# Patient Record
Sex: Male | Born: 1950
Health system: Southern US, Community
[De-identification: ages and names within clinical notes are randomized; demographics above are authoritative.]

## PROBLEM LIST (undated history)

## (undated) DIAGNOSIS — Z8781 Personal history of (healed) traumatic fracture: Secondary | ICD-10-CM

## (undated) DIAGNOSIS — I10 Essential (primary) hypertension: Secondary | ICD-10-CM

## (undated) DIAGNOSIS — M1990 Unspecified osteoarthritis, unspecified site: Secondary | ICD-10-CM

## (undated) HISTORY — DX: Personal history of (healed) traumatic fracture: Z87.81

## (undated) HISTORY — DX: Essential (primary) hypertension: I10

## (undated) HISTORY — PX: BACK SURGERY: SHX140

---

## 1997-10-01 ENCOUNTER — Emergency Department (HOSPITAL_COMMUNITY): Admission: EM | Admit: 1997-10-01 | Discharge: 1997-10-01 | Payer: Self-pay | Admitting: Emergency Medicine

## 1997-12-10 ENCOUNTER — Encounter: Admission: RE | Admit: 1997-12-10 | Discharge: 1998-03-10 | Payer: Self-pay | Admitting: Neurosurgery

## 2002-08-14 ENCOUNTER — Encounter: Payer: Self-pay | Admitting: Family Medicine

## 2002-08-14 ENCOUNTER — Encounter: Admission: RE | Admit: 2002-08-14 | Discharge: 2002-08-14 | Payer: Self-pay | Admitting: Anesthesiology

## 2004-07-27 ENCOUNTER — Emergency Department (HOSPITAL_COMMUNITY): Admission: EM | Admit: 2004-07-27 | Discharge: 2004-07-27 | Payer: Self-pay

## 2004-09-02 ENCOUNTER — Emergency Department (HOSPITAL_COMMUNITY): Admission: EM | Admit: 2004-09-02 | Discharge: 2004-09-02 | Payer: Self-pay | Admitting: Family Medicine

## 2004-09-16 ENCOUNTER — Encounter: Admission: RE | Admit: 2004-09-16 | Discharge: 2004-09-16 | Payer: Self-pay | Admitting: Orthopedic Surgery

## 2005-12-11 ENCOUNTER — Emergency Department (HOSPITAL_COMMUNITY): Admission: EM | Admit: 2005-12-11 | Discharge: 2005-12-11 | Payer: Self-pay | Admitting: Emergency Medicine

## 2006-03-03 ENCOUNTER — Emergency Department (HOSPITAL_COMMUNITY): Admission: EM | Admit: 2006-03-03 | Discharge: 2006-03-04 | Payer: Self-pay | Admitting: Emergency Medicine

## 2008-11-07 ENCOUNTER — Emergency Department (HOSPITAL_COMMUNITY): Admission: EM | Admit: 2008-11-07 | Discharge: 2008-11-07 | Payer: Self-pay | Admitting: Emergency Medicine

## 2009-05-29 ENCOUNTER — Emergency Department (HOSPITAL_COMMUNITY): Admission: EM | Admit: 2009-05-29 | Discharge: 2009-05-29 | Payer: Self-pay | Admitting: Emergency Medicine

## 2010-10-05 LAB — URINALYSIS, ROUTINE W REFLEX MICROSCOPIC
Glucose, UA: NEGATIVE mg/dL
Hgb urine dipstick: NEGATIVE
Nitrite: NEGATIVE
Specific Gravity, Urine: 1.018 (ref 1.005–1.030)
Urobilinogen, UA: 2 mg/dL — ABNORMAL HIGH (ref 0.0–1.0)

## 2010-10-11 LAB — POCT URINALYSIS DIP (DEVICE)
Bilirubin Urine: NEGATIVE
Glucose, UA: NEGATIVE mg/dL
Ketones, ur: NEGATIVE mg/dL
Nitrite: NEGATIVE
Specific Gravity, Urine: 1.01 (ref 1.005–1.030)
Urobilinogen, UA: 0.2 mg/dL (ref 0.0–1.0)
pH: 5.5 (ref 5.0–8.0)

## 2010-11-18 NOTE — Op Note (Signed)
NAME:  Austin Harrington, SPRUNG NO.:  [id number]   MEDICAL RECORD NO.:  [id number]          PATIENT TYPE:  EMS   LOCATION:  MAJO                         FACILITY:  [facility]   PHYSICIAN:  Nadara Mustard, MD     DATE OF BIRTH:  10-23-1950   DATE OF PROCEDURE:  12/11/2005  DATE OF DISCHARGE:                                 OPERATIVE REPORT   HISTORY OF PRESENT ILLNESS:  The patient is a 60 year old gentleman who  states he was working under his car when the jack slipped and he sustained a  crush injury to the left hand.  He had a ring on the ring finger and this  was a spare to the crushing injury due to the ring, which was partially  crushed but he did sustain a open fracture of the tuft of the long and  little finger to the left hand with complete avulsion of the nail bed.  The  patient is seen for evaluation and treatment of his orthopedic injury.  For  list of medications, allergies, previous surgeries, family history, social  history, review of systems and vital signs, please see his history and  physical summary sheet.  Of note, the patient has no known drug allergies.  His tetanus is up-to-date 1 year ago.   OBJECTIVE EXAMINATION:  He has an open tuft fracture with complete nail bed  laceration of the long and little finger.  FDS and FDP a grossly intact and  sensation is unable to be judged due to a previous digital block.  Radiograph shows a comminuted tuft fracture of the long and little finger.   ASSESSMENT:  Open comminuted tuft fracture of the left hand long and little  finger with a nail bed laceration.   PLAN:  After informed and sterile prepping, the patient's left hand was  prepped using Betadine, draped into a sterile field.  Additional digital  block was performed with 10 mL of 1% lidocaine plain to the little and ring  and long finger.  The ring was removed without complications.  The open  fracture of the little and ring finger were irrigated with normal  saline and  Betadine.  The skin lacerations were repaired to both fingers using 4-0  nylon.  The nail bed was repaired using 5-0 chromic.  The nail was replaced  to the long finger.  The little finger, the nail was absent.  The wounds  were covered with a triple antibiotic ointment, Xeroform, 4x4 dressings,  Kerlix and a Coban dressing.  The patient received 3.1 grams Unasyn IV.  He  was given a prescription for Augmentin 875 mg one p.o. b.i.d. He was given a  prescription for Vicodin for pain, instructions for elevation of the hand  above his heart at all times.  Plan to follow-up in the office in 1 week.      Nadara Mustard, MD  Electronically Signed     MVD/MEDQ  D:  12/11/2005  T:  12/12/2005  Job:  [id number]

## 2014-12-10 ENCOUNTER — Encounter (HOSPITAL_COMMUNITY): Payer: Self-pay | Admitting: *Deleted

## 2014-12-10 ENCOUNTER — Emergency Department (INDEPENDENT_AMBULATORY_CARE_PROVIDER_SITE_OTHER)
Admission: EM | Admit: 2014-12-10 | Discharge: 2014-12-10 | Disposition: A | Discharge status: Home / Self Care | Payer: Medicare Other | Source: Home / Self Care | Attending: Family Medicine | Admitting: Family Medicine

## 2014-12-10 DIAGNOSIS — R1013 Epigastric pain: Secondary | ICD-10-CM

## 2014-12-10 DIAGNOSIS — M545 Low back pain, unspecified: Secondary | ICD-10-CM

## 2014-12-10 DIAGNOSIS — K219 Gastro-esophageal reflux disease without esophagitis: Secondary | ICD-10-CM

## 2014-12-10 DIAGNOSIS — S39012A Strain of muscle, fascia and tendon of lower back, initial encounter: Secondary | ICD-10-CM

## 2014-12-10 MED ORDER — FAMOTIDINE 20 MG PO TABS: ORAL_TABLET | ORAL | Status: DC

## 2014-12-10 MED ORDER — OMEPRAZOLE 20 MG PO CPDR: 20.0000 mg | DELAYED_RELEASE_CAPSULE | Freq: Every day | ORAL | Status: DC

## 2014-12-10 MED ORDER — DICLOFENAC SODIUM 1 % TD GEL: 1.0000 "application " | Freq: Four times a day (QID) | TRANSDERMAL | Status: DC

## 2014-12-10 NOTE — ED Notes (Signed)
Pt  Reports        Symptoms  Of          Acid  Reflux   With   Burning  Sensation  Epigastric  Area           Pt   Sitting  Upright on  The  Exam table   Speaking in   Complete  sentances  In no   acute  Distress

## 2014-12-10 NOTE — ED Provider Notes (Signed)
CSN: [id number]     Arrival date & time 12/10/14  1425 History   First MD Initiated Contact with Patient 12/10/14 1544     Chief Complaint  Patient presents with  . Gastrophageal Reflux   (Consider location/radiation/quality/duration/timing/severity/associated sxs/prior Treatment) HPI Comments: 64 year old male complaining of reflux, heartburn and belching for 1 month. He states it is worse when he lies down and after eating. Denies heaviness, tightness, fullness or pressure in the chest. Denies vomiting. Denies inability or pain with swallowing. Denies abdominal pain, nausea or vomiting.  Second complaint is that of right low back pain for 2 weeks. Gradual onset. No known etiology. It is located in the right para lumbar musculature that radiates to the right posterior hip. It is tender to these areas and it is worse with movement such as bending, twisting, pulling and lifting. Denies focal paresthesias or weakness. Denies radicular pain.   No past medical history on file. No past surgical history on file. No family history on file. History  Substance Use Topics  . Smoking status: Not on file  . Smokeless tobacco: Not on file  . Alcohol Use: Not on file    Review of Systems  Constitutional: Negative.        Review of systems as above.  All other systems reviewed and are negative.   Allergies  Review of patient's allergies indicates not on file.  Home Medications   Prior to Admission medications   Medication Sig Start Date End Date Taking? Authorizing Provider  diclofenac sodium (VOLTAREN) 1 % GEL Apply 1 application topically 4 (four) times daily. To back 12/10/14   Hayden Rasmussen, NP  famotidine (PEPCID) 20 MG tablet One tab 2 h before hs 12/10/14   Hayden Rasmussen, NP  omeprazole (PRILOSEC) 20 MG capsule Take 1 capsule (20 mg total) by mouth daily. 12/10/14   Hayden Rasmussen, NP   BP 141/66 mmHg  Pulse 65  Temp(Src) 97.5 F (36.4 C) (Oral)  Resp 16  SpO2 100% Physical Exam   Constitutional: He is oriented to person, place, and time. He appears well-developed and well-nourished.  HENT:  Head: Normocephalic and atraumatic.  Eyes: EOM are normal.  Neck: Normal range of motion. Neck supple.  Musculoskeletal:  Tenderness to the right para lumbar musculature just beneath the posterior costal margin to the right hip. Pain is reproducible as patient leans forward and particularly towards the left. No spinal tenderness, deformity or discoloration.  Neurological: He is alert and oriented to person, place, and time. No cranial nerve deficit. He exhibits normal muscle tone.  Skin: Skin is warm and dry.  Psychiatric: He has a normal mood and affect.    ED Course  Procedures (including critical care time) Labs Review Labs Reviewed - No data to display  Imaging Review No results found.   MDM   1. Right-sided low back pain without sciatica   2. Lumbar strain, initial encounter   3. Gastroesophageal reflux disease, esophagitis presence not specified   4. Dyspepsia    Omeprazole 20 mg daily Pepcid 20 mg 2 hours before bedtime Limit eating 2 hours before bedtime and no greasy or spicy foods. Redress instructions Limit bending, lifting, pulling for the next couple of weeks to lie her back to heal. Apply heat and diclofenac gel to the areas of soreness. Perform stretches as demonstrated. Follow-up with her primary care doctor.   Hayden Rasmussen, NP 12/10/14 [phone]

## 2014-12-10 NOTE — Discharge Instructions (Signed)
Back Pain, Adult °Low back pain is very common. About 1 in 5 people have back pain. The cause of low back pain is rarely dangerous. The pain often gets better over time. About half of people with a sudden onset of back pain feel better in just 2 weeks. About 8 in 10 people feel better by 6 weeks.  °CAUSES °Some common causes of back pain include: °· Strain of the muscles or ligaments supporting the spine. °· Wear and tear (degeneration) of the spinal discs. °· Arthritis. °· Direct injury to the back. °DIAGNOSIS °Most of the time, the direct cause of low back pain is not known. However, back pain can be treated effectively even when the exact cause of the pain is unknown. Answering your caregiver's questions about your overall health and symptoms is one of the most accurate ways to make sure the cause of your pain is not dangerous. If your caregiver needs more information, he or she may order lab work or imaging tests (X-rays or MRIs). However, even if imaging tests show changes in your back, this usually does not require surgery. °HOME CARE INSTRUCTIONS °For many people, back pain returns. Since low back pain is rarely dangerous, it is often a condition that people can learn to manage on their own.  °· Remain active. It is stressful on the back to sit or stand in one place. Do not sit, drive, or stand in one place for more than 30 minutes at a time. Take short walks on level surfaces as soon as pain allows. Try to increase the length of time you walk each day. °· Do not stay in bed. Resting more than 1 or 2 days can delay your recovery. °· Do not avoid exercise or work. Your body is made to move. It is not dangerous to be active, even though your back may hurt. Your back will likely heal faster if you return to being active before your pain is gone. °· Pay attention to your body when you  bend and lift. Many people have less discomfort when lifting if they bend their knees, keep the load close to their bodies, and  avoid twisting. Often, the most comfortable positions are those that put less stress on your recovering back. °· Find a comfortable position to sleep. Use a firm mattress and lie on your side with your knees slightly bent. If you lie on your back, put a pillow under your knees. °· Only take over-the-counter or prescription medicines as directed by your caregiver. Over-the-counter medicines to reduce pain and inflammation are often the most helpful. Your caregiver may prescribe muscle relaxant drugs. These medicines help dull your pain so you can more quickly return to your normal activities and healthy exercise. °· Put ice on the injured area. °¨ Put ice in a plastic bag. °¨ Place a towel between your skin and the bag. °¨ Leave the ice on for 15-20 minutes, 03-04 times a day for the first 2 to 3 days. After that, ice and heat may be alternated to reduce pain and spasms. °· Ask your caregiver about trying back exercises and gentle massage. This may be of some benefit. °· Avoid feeling anxious or stressed. Stress increases muscle tension and can worsen back pain. It is important to recognize when you are anxious or stressed and learn ways to manage it. Exercise is a great option. °SEEK MEDICAL CARE IF: °· You have pain that is not relieved with rest or medicine. °· You have pain that does not improve in 1 week. °· You have new symptoms. °· You are generally not feeling well. °SEEK   IMMEDIATE MEDICAL CARE IF:   You have pain that radiates from your back into your legs.  You develop new bowel or bladder control problems.  You have unusual weakness or numbness in your arms or legs.  You develop nausea or vomiting.  You develop abdominal pain.  You feel faint. Document Released: 06/19/2005 Document Revised: 12/19/2011 Document Reviewed: 10/21/2013 [facility] Patient Information 2015 [place], [place]. This information is not intended to replace advice given to you by your health care provider. Make sure you  discuss any questions you have with your health care provider.  Gastroesophageal Reflux Disease, Adult Gastroesophageal reflux disease (GERD) happens when acid from your stomach goes into your food pipe (esophagus). The acid can cause a burning feeling in your chest. Over time, the acid can make small holes (ulcers) in your food pipe.  HOME CARE  Ask your doctor for advice about:  Losing weight.  Quitting smoking.  Alcohol use.  Avoid foods and drinks that make your problems worse. You may want to avoid:  Caffeine and alcohol.  Chocolate.  Mints.  Garlic and onions.  Spicy foods.  Citrus fruits, such as oranges, lemons, or limes.  Foods that contain tomato, such as sauce, chili, salsa, and pizza.  Fried and fatty foods.  Avoid lying down for 3 hours before you go to bed or before you take a nap.  Eat small meals often, instead of large meals.  Wear loose-fitting clothing. Do not wear anything tight around your waist.  Raise (elevate) the head of your bed 6 to 8 inches with wood blocks. Using extra pillows does not help.  Only take medicines as told by your doctor.  Do not take aspirin or ibuprofen. GET HELP RIGHT AWAY IF:   You have pain in your arms, neck, jaw, teeth, or back.  Your pain gets worse or changes.  You feel sick to your stomach (nauseous), throw up (vomit), or sweat (diaphoresis).  You feel short of breath, or you pass out (faint).  Your throw up is green, yellow, black, or looks like coffee grounds or blood.  Your poop (stool) is red, bloody, or black. MAKE SURE YOU:   Understand these instructions.  Will watch your condition.  Will get help right away if you are not doing well or get worse. Document Released: 12/06/2007 Document Revised: 09/11/2011 Document Reviewed: 01/06/2011 [facility] Patient Information 2015 [place], [place]. This information is not intended to replace advice given to you by your health care provider. Make sure you  discuss any questions you have with your health care provider.  Lumbosacral Strain Lumbosacral strain is a strain of any of the parts that make up your lumbosacral vertebrae. Your lumbosacral vertebrae are the bones that make up the lower third of your backbone. Your lumbosacral vertebrae are held together by muscles and tough, fibrous tissue (ligaments).  CAUSES  A sudden blow to your back can cause lumbosacral strain. Also, anything that causes an excessive stretch of the muscles in the low back can cause this strain. This is typically seen when people exert themselves strenuously, fall, lift heavy objects, bend, or crouch repeatedly. RISK FACTORS  Physically demanding work.  Participation in pushing or pulling sports or sports that require a sudden twist of the back (tennis, golf, baseball).  Weight lifting.  Excessive lower back curvature.  Forward-tilted pelvis.  Weak back or abdominal muscles or both.  Tight hamstrings. SIGNS AND SYMPTOMS  Lumbosacral strain may cause pain in the area of your injury or pain  that moves (radiates) down your leg.  DIAGNOSIS Your health care provider can often diagnose lumbosacral strain through a physical exam. In some cases, you may need tests such as X-ray exams.  TREATMENT  Treatment for your lower back injury depends on many factors that your clinician will have to evaluate. However, most treatment will include the use of anti-inflammatory medicines. HOME CARE INSTRUCTIONS   Avoid hard physical activities (tennis, racquetball, waterskiing) if you are not in proper physical condition for it. This may aggravate or create problems.  If you have a back problem, avoid sports requiring sudden body movements. Swimming and walking are generally safer activities.  Maintain good posture.  Maintain a healthy weight.  For acute conditions, you may put ice on the injured area.  Put ice in a plastic bag.  Place a towel between your skin and the  bag.  Leave the ice on for 20 minutes, 2-3 times a day.  When the low back starts healing, stretching and strengthening exercises may be recommended. SEEK MEDICAL CARE IF:  Your back pain is getting worse.  You experience severe back pain not relieved with medicines. SEEK IMMEDIATE MEDICAL CARE IF:   You have numbness, tingling, weakness, or problems with the use of your arms or legs.  There is a change in bowel or bladder control.  You have increasing pain in any area of the body, including your belly (abdomen).  You notice shortness of breath, dizziness, or feel faint.  You feel sick to your stomach (nauseous), are throwing up (vomiting), or become sweaty.  You notice discoloration of your toes or legs, or your feet get very cold. MAKE SURE YOU:   Understand these instructions.  Will watch your condition.  Will get help right away if you are not doing well or get worse. Document Released: 03/29/2005 Document Revised: 06/24/2013 Document Reviewed: 02/05/2013 [facility] Patient Information 2015 [place], [place]. This information is not intended to replace advice given to you by your health care provider. Make sure you discuss any questions you have with your health care provider.

## 2015-05-18 DIAGNOSIS — H11821 Conjunctivochalasis, right eye: Secondary | ICD-10-CM | POA: Diagnosis not present

## 2015-05-18 DIAGNOSIS — H25812 Combined forms of age-related cataract, left eye: Secondary | ICD-10-CM | POA: Diagnosis not present

## 2015-05-18 DIAGNOSIS — H2511 Age-related nuclear cataract, right eye: Secondary | ICD-10-CM | POA: Diagnosis not present

## 2015-08-17 ENCOUNTER — Other Ambulatory Visit: Payer: Self-pay | Admitting: Cardiovascular Disease

## 2015-08-17 ENCOUNTER — Ambulatory Visit
Admission: RE | Admit: 2015-08-17 | Discharge: 2015-08-17 | Disposition: A | Discharge status: Home / Self Care | Payer: Commercial Managed Care - HMO | Source: Ambulatory Visit | Attending: Cardiovascular Disease | Admitting: Cardiovascular Disease

## 2015-08-17 DIAGNOSIS — R0602 Shortness of breath: Secondary | ICD-10-CM

## 2015-08-19 ENCOUNTER — Encounter (HOSPITAL_COMMUNITY)
Admission: RE | Disposition: A | Discharge status: Home / Self Care | Payer: Self-pay | Source: Ambulatory Visit | Attending: Cardiovascular Disease

## 2015-08-19 ENCOUNTER — Ambulatory Visit (HOSPITAL_COMMUNITY)
Admission: RE | Admit: 2015-08-19 | Discharge: 2015-08-19 | Disposition: A | Discharge status: Home / Self Care | Payer: Commercial Managed Care - HMO | Source: Ambulatory Visit | Attending: Cardiovascular Disease | Admitting: Cardiovascular Disease

## 2015-08-19 DIAGNOSIS — R0789 Other chest pain: Secondary | ICD-10-CM | POA: Diagnosis not present

## 2015-08-19 DIAGNOSIS — Z8249 Family history of ischemic heart disease and other diseases of the circulatory system: Secondary | ICD-10-CM | POA: Insufficient documentation

## 2015-08-19 DIAGNOSIS — Z7982 Long term (current) use of aspirin: Secondary | ICD-10-CM | POA: Diagnosis not present

## 2015-08-19 DIAGNOSIS — E7800 Pure hypercholesterolemia, unspecified: Secondary | ICD-10-CM | POA: Diagnosis not present

## 2015-08-19 DIAGNOSIS — R079 Chest pain, unspecified: Secondary | ICD-10-CM | POA: Diagnosis present

## 2015-08-19 DIAGNOSIS — R0602 Shortness of breath: Secondary | ICD-10-CM | POA: Diagnosis present

## 2015-08-19 HISTORY — PX: CARDIAC CATHETERIZATION: SHX172

## 2015-08-19 LAB — BASIC METABOLIC PANEL
Anion gap: 9 (ref 5–15)
BUN: 10 mg/dL (ref 6–20)
CALCIUM: 9.8 mg/dL (ref 8.9–10.3)
CO2: 26 mmol/L (ref 22–32)
CREATININE: 0.99 mg/dL (ref 0.61–1.24)
Chloride: 105 mmol/L (ref 101–111)
GFR calc Af Amer: >60 mL/min (ref >60)
Glucose, Bld: 103 mg/dL — ABNORMAL HIGH (ref 65–99)
POTASSIUM: 4.4 mmol/L (ref 3.5–5.1)
SODIUM: 140 mmol/L (ref 135–145)

## 2015-08-19 LAB — PROTIME-INR
INR: 1 (ref 0.00–1.49)
PROTHROMBIN TIME: 13.4 s (ref 11.6–15.2)

## 2015-08-19 LAB — CBC
HCT: 44 % (ref 39.0–52.0)
Hemoglobin: 14.8 g/dL (ref 13.0–17.0)
MCH: 29.9 pg (ref 26.0–34.0)
MCHC: 33.6 g/dL (ref 30.0–36.0)
MCV: 88.9 fL (ref 78.0–100.0)
PLATELETS: 228 10*3/uL (ref 150–400)
RBC: 4.95 MIL/uL (ref 4.22–5.81)
RDW: 12.1 % (ref 11.5–15.5)
WBC: 5 10*3/uL (ref 4.0–10.5)

## 2015-08-19 SURGERY — LEFT HEART CATH AND CORONARY ANGIOGRAPHY

## 2015-08-19 MED ORDER — FENTANYL CITRATE (PF) 100 MCG/2ML IJ SOLN
INTRAMUSCULAR | Status: DC | PRN
  Administered 2015-08-19: 25 ug via INTRAVENOUS

## 2015-08-19 MED ORDER — SODIUM CHLORIDE 0.9% FLUSH: 3.0000 mL | Freq: Two times a day (BID) | INTRAVENOUS | Status: DC

## 2015-08-19 MED ORDER — MIDAZOLAM HCL 2 MG/2ML IJ SOLN
INTRAMUSCULAR | Status: DC | PRN
  Administered 2015-08-19: 1 mg via INTRAVENOUS

## 2015-08-19 MED ORDER — LIDOCAINE HCL (PF) 1 % IJ SOLN
INTRAMUSCULAR | Status: AC
  Filled 2015-08-19: qty 30

## 2015-08-19 MED ORDER — ONDANSETRON HCL 4 MG/2ML IJ SOLN: 4.0000 mg | Freq: Four times a day (QID) | INTRAMUSCULAR | Status: DC | PRN

## 2015-08-19 MED ORDER — SODIUM CHLORIDE 0.9% FLUSH: 3.0000 mL | INTRAVENOUS | Status: DC | PRN

## 2015-08-19 MED ORDER — FENTANYL CITRATE (PF) 100 MCG/2ML IJ SOLN
INTRAMUSCULAR | Status: AC
  Filled 2015-08-19: qty 2

## 2015-08-19 MED ORDER — HEPARIN (PORCINE) IN NACL 2-0.9 UNIT/ML-% IJ SOLN
INTRAMUSCULAR | Status: AC
  Filled 2015-08-19: qty 1000

## 2015-08-19 MED ORDER — MIDAZOLAM HCL 2 MG/2ML IJ SOLN
INTRAMUSCULAR | Status: AC
  Filled 2015-08-19: qty 2

## 2015-08-19 MED ORDER — HEPARIN (PORCINE) IN NACL 2-0.9 UNIT/ML-% IJ SOLN
INTRAMUSCULAR | Status: DC | PRN
  Administered 2015-08-19: 1000 mL

## 2015-08-19 MED ORDER — ASPIRIN 81 MG PO CHEW
CHEWABLE_TABLET | ORAL | Status: AC
  Administered 2015-08-19: 81 mg via ORAL
  Filled 2015-08-19: qty 1

## 2015-08-19 MED ORDER — ACETAMINOPHEN 325 MG PO TABS
650.0000 mg | ORAL_TABLET | ORAL | Status: DC | PRN
  Administered 2015-08-19: 650 mg via ORAL

## 2015-08-19 MED ORDER — LIDOCAINE HCL (PF) 1 % IJ SOLN
INTRAMUSCULAR | Status: DC | PRN
  Administered 2015-08-19: 16 mL

## 2015-08-19 MED ORDER — SODIUM CHLORIDE 0.9 % WEIGHT BASED INFUSION: 1.0000 mL/kg/h | INTRAVENOUS | Status: DC

## 2015-08-19 MED ORDER — ASPIRIN 81 MG PO CHEW
81.0000 mg | CHEWABLE_TABLET | ORAL | Status: AC
  Administered 2015-08-19: 81 mg via ORAL

## 2015-08-19 MED ORDER — SODIUM CHLORIDE 0.9 % IV SOLN: 250.0000 mL | INTRAVENOUS | Status: DC | PRN

## 2015-08-19 MED ORDER — ACETAMINOPHEN 325 MG PO TABS
ORAL_TABLET | ORAL | Status: AC
  Filled 2015-08-19: qty 2

## 2015-08-19 MED ORDER — SODIUM CHLORIDE 0.9 % WEIGHT BASED INFUSION
3.0000 mL/kg/h | INTRAVENOUS | Status: AC
  Administered 2015-08-19: 999 mL/h via INTRAVENOUS
  Administered 2015-08-19: 3 mL/kg/h via INTRAVENOUS

## 2015-08-19 MED ORDER — IOHEXOL 350 MG/ML SOLN
INTRAVENOUS | Status: DC | PRN
  Administered 2015-08-19: 48 mL via INTRAVENOUS

## 2015-08-19 MED ORDER — SODIUM CHLORIDE 0.9 % WEIGHT BASED INFUSION: 3.0000 mL/kg/h | INTRAVENOUS | Status: DC

## 2015-08-19 SURGICAL SUPPLY — 8 items
CATH INFINITI 5FR MULTPACK ANG (CATHETERS) ×1 IMPLANT
CATH SITESEER 5F NTR (CATHETERS) ×1 IMPLANT
KIT HEART LEFT (KITS) ×1 IMPLANT
PACK CARDIAC CATHETERIZATION (CUSTOM PROCEDURE TRAY) ×1 IMPLANT
SHEATH PINNACLE 5F 10CM (SHEATH) ×1 IMPLANT
SYR MEDRAD MARK V 150ML (SYRINGE) ×2 IMPLANT
TRANSDUCER W/STOPCOCK (MISCELLANEOUS) ×1 IMPLANT
WIRE EMERALD 3MM-J .035X150CM (WIRE) ×1 IMPLANT

## 2015-08-19 NOTE — Discharge Instructions (Signed)

## 2015-08-19 NOTE — Interval H&P Note (Signed)
History and Physical Interval Note:  08/19/2015 1:38 PM  Austin Harrington  has presented today for surgery, with the diagnosis of positive stress test  The various methods of treatment have been discussed with the patient and family. After consideration of risks, benefits and other options for treatment, the patient has consented to  Procedure(s): Left Heart Cath and Coronary Angiography (N/A) as a surgical intervention .  The patient's history has been reviewed, patient examined, no change in status, stable for surgery.  I have reviewed the patient's chart and labs.  Questions were answered to the patient's satisfaction.     Morris Longenecker S

## 2015-08-19 NOTE — H&P (Signed)
Referring Physician:  MARIEL GAUDIN is an 65 y.o. male.                       Chief Complaint: Chest pressure  HPI: 65 year old male has chest pressure and shortness of breath x 3-4 months. He underwent TMST showing 2-5 mm ST depression in inferior leads with frequent PVCs. No fever or cough.   Past medical history: No DM, II, No hypertension, No smoking, No alcohol or drug use. Positive elevated cholesterol. No MI, Positive FH of premature CAD.   Past surgical history: Back surgery 1994-11-15.  Family history: Mom died of MI age 37, Dad died of MI age 82. Five brothers and 2 sisters, living and well.   Social History:  has no tobacco, alcohol, and drug use.  Allergies: No Known Allergies  Medications Prior to Admission  Medication Sig Dispense Refill  . aspirin 81 MG tablet Take 81 mg by mouth daily.      No results found for this or any previous visit (from the past 48 hour(s)). Dg Chest 2 View  08/17/2015  CLINICAL DATA:  Shortness of Breath for several months EXAM: CHEST  2 VIEW COMPARISON:  03/03/2006 FINDINGS: Cardiac shadow is stable. The lungs are well aerated bilaterally. No focal infiltrate or sizable effusion is seen. No acute bony abnormality is noted. IMPRESSION: No acute abnormality seen. Electronically Signed   By: Alcide Clever M.D.   On: 08/17/2015 11:39    Review Of Systems No weight gain or loss No vision change. Wears reading glasses.  No dentures.  No Hearing loss. No asthma, COPD or pneumonia. Positive shortness of breath. Positive chest pain. No palpitation. No leg edema. No abdominal pain, nausea, vomiting or diarrhea. No GI bleed. No GU bleed or kidney stone. No stroke, seizures or psychiatric admission.  Blood pressure 138/79, pulse 63, temperature 97.6 F (36.4 C), temperature source Oral, resp. rate 18, height  (1.753 m), weight 97.07 kg (214 lb), SpO2 100 %. General appearance: alert and cooperative Head: Normocephalic, without obvious abnormality,  atraumatic Eyes: conjunctivae/corneas clear. PERRL, EOM's intact. Fundi benign. Throat: lips, mucosa, and tongue normal; teeth and gums normal Neck: no adenopathy, no carotid bruit, no JVD, supple, symmetrical, trachea midline and thyroid not enlarged, symmetric, no tenderness/mass/nodules Resp: clear to auscultation bilaterally Cardio: regular rate and rhythm, S1, S2 normal, no murmur, click, rub or gallop GI: soft, non-tender; bowel sounds normal; no masses,  no organomegaly Extremities: extremities normal, atraumatic, no cyanosis or edema Skin: Skin color, texture, turgor normal. No rashes or lesions Neurologic: Alert and oriented X 3, normal strength and tone. Normal symmetric reflexes. Normal coordination and gait  Assessment/Plan Chest pain Shortness of breath Abnormal TMST  Cardiac cath today.  Patient understood procedure, risks and alternatives.  Ricki Rodriguez, MD  08/19/2015, 9:38 AM

## 2015-08-20 ENCOUNTER — Encounter (HOSPITAL_COMMUNITY): Payer: Self-pay | Admitting: Cardiovascular Disease

## 2015-12-19 ENCOUNTER — Encounter (HOSPITAL_COMMUNITY): Payer: Self-pay | Admitting: Emergency Medicine

## 2015-12-19 ENCOUNTER — Ambulatory Visit (HOSPITAL_COMMUNITY)
Admission: EM | Admit: 2015-12-19 | Discharge: 2015-12-19 | Disposition: A | Discharge status: Home / Self Care | Payer: Commercial Managed Care - HMO | Attending: Family Medicine | Admitting: Family Medicine

## 2015-12-19 DIAGNOSIS — M5431 Sciatica, right side: Secondary | ICD-10-CM | POA: Diagnosis not present

## 2015-12-19 DIAGNOSIS — S161XXA Strain of muscle, fascia and tendon at neck level, initial encounter: Secondary | ICD-10-CM

## 2015-12-19 MED ORDER — HYDROCODONE-ACETAMINOPHEN 5-325 MG PO TABS: 1.0000 | ORAL_TABLET | ORAL | Status: DC | PRN

## 2015-12-19 MED ORDER — PREDNISONE 20 MG PO TABS: 40.0000 mg | ORAL_TABLET | Freq: Every day | ORAL | Status: DC

## 2015-12-19 MED ORDER — PREDNISONE 20 MG PO TABS
ORAL_TABLET | ORAL | Status: AC
  Filled 2015-12-19: qty 3

## 2015-12-19 MED ORDER — PREDNISONE 20 MG PO TABS
60.0000 mg | ORAL_TABLET | Freq: Once | ORAL | Status: AC
  Administered 2015-12-19: 60 mg via ORAL

## 2015-12-19 MED ORDER — CYCLOBENZAPRINE HCL 10 MG PO TABS: 10.0000 mg | ORAL_TABLET | Freq: Three times a day (TID) | ORAL | Status: DC | PRN
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
## 2016-01-12 ENCOUNTER — Encounter (HOSPITAL_COMMUNITY): Payer: Self-pay | Admitting: *Deleted

## 2016-01-12 ENCOUNTER — Emergency Department (HOSPITAL_COMMUNITY)
Admission: EM | Admit: 2016-01-12 | Discharge: 2016-01-12 | Disposition: A | Discharge status: Home / Self Care | Payer: Commercial Managed Care - HMO | Attending: Emergency Medicine | Admitting: Emergency Medicine

## 2016-01-12 DIAGNOSIS — Z7982 Long term (current) use of aspirin: Secondary | ICD-10-CM | POA: Insufficient documentation

## 2016-01-12 DIAGNOSIS — Z955 Presence of coronary angioplasty implant and graft: Secondary | ICD-10-CM | POA: Insufficient documentation

## 2016-01-12 DIAGNOSIS — M5441 Lumbago with sciatica, right side: Secondary | ICD-10-CM | POA: Diagnosis not present

## 2016-01-12 DIAGNOSIS — Z87891 Personal history of nicotine dependence: Secondary | ICD-10-CM | POA: Insufficient documentation

## 2016-01-12 DIAGNOSIS — M542 Cervicalgia: Secondary | ICD-10-CM | POA: Insufficient documentation

## 2016-01-12 NOTE — Discharge Instructions (Signed)
Cervical Strain and Sprain With Rehab  Cervical strain and sprain are injuries that commonly occur with "whiplash" injuries. Whiplash occurs when the neck is forcefully whipped backward or forward, such as during a motor vehicle accident or during contact sports. The muscles, ligaments, tendons, discs, and nerves of the neck are susceptible to injury when this occurs.  RISK FACTORS  Risk of having a whiplash injury increases if:  · Osteoarthritis of the spine.  · Situations that make head or neck accidents or trauma more likely.  · High-risk sports (football, rugby, wrestling, hockey, auto racing, gymnastics, diving, contact karate, or boxing).  · Poor strength and flexibility of the neck.  · Previous neck injury.  · Poor tackling technique.  · Improperly fitted or padded equipment.  SYMPTOMS   · Pain or stiffness in the front or back of neck or both.  · Symptoms may present immediately or up to 24 hours after injury.  · Dizziness, headache, nausea, and vomiting.  · Muscle spasm with soreness and stiffness in the neck.  · Tenderness and swelling at the injury site.  PREVENTION  · Learn and use proper technique (avoid tackling with the head, spearing, and head-butting; use proper falling techniques to avoid landing on the head).  · Warm up and stretch properly before activity.  · Maintain physical fitness:    Strength, flexibility, and endurance.    Cardiovascular fitness.  · Wear properly fitted and padded protective equipment, such as padded soft collars, for participation in contact sports.  PROGNOSIS   Recovery from cervical strain and sprain injuries is dependent on the extent of the injury. These injuries are usually curable in 1 week to 3 months with appropriate treatment.   RELATED COMPLICATIONS   · Temporary numbness and weakness may occur if the nerve roots are damaged, and this may persist until the nerve has completely healed.  · Chronic pain due to frequent recurrence of symptoms.  · Prolonged healing,  especially if activity is resumed too soon (before complete recovery).  TREATMENT   Treatment initially involves the use of ice and medication to help reduce pain and inflammation. It is also important to perform strengthening and stretching exercises and modify activities that worsen symptoms so the injury does not get worse. These exercises may be performed at home or with a therapist. For patients who experience severe symptoms, a soft, padded collar may be recommended to be worn around the neck.   Improving your posture may help reduce symptoms. Posture improvement includes pulling your chin and abdomen in while sitting or standing. If you are sitting, sit in a firm chair with your buttocks against the back of the chair. While sleeping, try replacing your pillow with a small towel rolled to 2 inches in diameter, or use a cervical pillow or soft cervical collar. Poor sleeping positions delay healing.   For patients with nerve root damage, which causes numbness or weakness, the use of a cervical traction apparatus may be recommended. Surgery is rarely necessary for these injuries. However, cervical strain and sprains that are present at birth (congenital) may require surgery.  MEDICATION   · If pain medication is necessary, nonsteroidal anti-inflammatory medications, such as aspirin and ibuprofen, or other minor pain relievers, such as acetaminophen, are often recommended.  · Do not take pain medication for 7 days before surgery.  · Prescription pain relievers may be given if deemed necessary by your caregiver. Use only as directed and only as much as you need.    HEAT AND COLD:   · Cold treatment (icing) relieves pain and reduces inflammation. Cold treatment should be applied for 10 to 15 minutes every 2 to 3 hours for inflammation and pain and immediately after any activity that aggravates your symptoms. Use ice packs or an ice massage.  · Heat treatment may be used prior to performing the stretching and  strengthening activities prescribed by your caregiver, physical therapist, or athletic trainer. Use a heat pack or a warm soak.  SEEK MEDICAL CARE IF:   · Symptoms get worse or do not improve in 2 weeks despite treatment.  · New, unexplained symptoms develop (drugs used in treatment may produce side effects).  EXERCISES  RANGE OF MOTION (ROM) AND STRETCHING EXERCISES - Cervical Strain and Sprain  These exercises may help you when beginning to rehabilitate your injury. In order to successfully resolve your symptoms, you must improve your posture. These exercises are designed to help reduce the forward-head and rounded-shoulder posture which contributes to this condition. Your symptoms may resolve with or without further involvement from your physician, physical therapist or athletic trainer. While completing these exercises, remember:   · Restoring tissue flexibility helps normal motion to return to the joints. This allows healthier, less painful movement and activity.  · An effective stretch should be held for at least 20 seconds, although you may need to begin with shorter hold times for comfort.  · A stretch should never be painful. You should only feel a gentle lengthening or release in the stretched tissue.  STRETCH- Axial Extensors  · Lie on your back on the floor. You may bend your knees for comfort. Place a rolled-up hand towel or dish towel, about 2 inches in diameter, under the part of your head that makes contact with the floor.  · Gently tuck your chin, as if trying to make a "double chin," until you feel a gentle stretch at the base of your head.  · Hold __________ seconds.  Repeat __________ times. Complete this exercise __________ times per day.   STRETCH - Axial Extension   · Stand or sit on a firm surface. Assume a good posture: chest up, shoulders drawn back, abdominal muscles slightly tense, knees unlocked (if standing) and feet hip width apart.  · Slowly retract your chin so your head slides back  and your chin slightly lowers. Continue to look straight ahead.  · You should feel a gentle stretch in the back of your head. Be certain not to feel an aggressive stretch since this can cause headaches later.  · Hold for __________ seconds.  Repeat __________ times. Complete this exercise __________ times per day.  STRETCH - Cervical Side Bend   · Stand or sit on a firm surface. Assume a good posture: chest up, shoulders drawn back, abdominal muscles slightly tense, knees unlocked (if standing) and feet hip width apart.  · Without letting your nose or shoulders move, slowly tip your right / left ear to your shoulder until your feel a gentle stretch in the muscles on the opposite side of your neck.  · Hold __________ seconds.  Repeat __________ times. Complete this exercise __________ times per day.  STRETCH - Cervical Rotators   · Stand or sit on a firm surface. Assume a good posture: chest up, shoulders drawn back, abdominal muscles slightly tense, knees unlocked (if standing) and feet hip width apart.  · Keeping your eyes level with the ground, slowly turn your head until you feel a gentle stretch along   the back and opposite side of your neck.  · Hold __________ seconds.  Repeat __________ times. Complete this exercise __________ times per day.  RANGE OF MOTION - Neck Circles   · Stand or sit on a firm surface. Assume a good posture: chest up, shoulders drawn back, abdominal muscles slightly tense, knees unlocked (if standing) and feet hip width apart.  · Gently roll your head down and around from the back of one shoulder to the back of the other. The motion should never be forced or painful.  · Repeat the motion 10-20 times, or until you feel the neck muscles relax and loosen.  Repeat __________ times. Complete the exercise __________ times per day.  STRENGTHENING EXERCISES - Cervical Strain and Sprain  These exercises may help you when beginning to rehabilitate your injury. They may resolve your symptoms with or  without further involvement from your physician, physical therapist, or athletic trainer. While completing these exercises, remember:   · Muscles can gain both the endurance and the strength needed for everyday activities through controlled exercises.  · Complete these exercises as instructed by your physician, physical therapist, or athletic trainer. Progress the resistance and repetitions only as guided.  · You may experience muscle soreness or fatigue, but the pain or discomfort you are trying to eliminate should never worsen during these exercises. If this pain does worsen, stop and make certain you are following the directions exactly. If the pain is still present after adjustments, discontinue the exercise until you can discuss the trouble with your clinician.  STRENGTH - Cervical Flexors, Isometric  · Face a wall, standing about 6 inches away. Place a small pillow, a ball about 6-8 inches in diameter, or a folded towel between your forehead and the wall.  · Slightly tuck your chin and gently push your forehead into the soft object. Push only with mild to moderate intensity, building up tension gradually. Keep your jaw and forehead relaxed.  · Hold 10 to 20 seconds. Keep your breathing relaxed.  · Release the tension slowly. Relax your neck muscles completely before you start the next repetition.  Repeat __________ times. Complete this exercise __________ times per day.  STRENGTH- Cervical Lateral Flexors, Isometric   · Stand about 6 inches away from a wall. Place a small pillow, a ball about 6-8 inches in diameter, or a folded towel between the side of your head and the wall.  · Slightly tuck your chin and gently tilt your head into the soft object. Push only with mild to moderate intensity, building up tension gradually. Keep your jaw and forehead relaxed.  · Hold 10 to 20 seconds. Keep your breathing relaxed.  · Release the tension slowly. Relax your neck muscles completely before you start the next  repetition.  Repeat __________ times. Complete this exercise __________ times per day.  STRENGTH - Cervical Extensors, Isometric   · Stand about 6 inches away from a wall. Place a small pillow, a ball about 6-8 inches in diameter, or a folded towel between the back of your head and the wall.  · Slightly tuck your chin and gently tilt your head back into the soft object. Push only with mild to moderate intensity, building up tension gradually. Keep your jaw and forehead relaxed.  · Hold 10 to 20 seconds. Keep your breathing relaxed.  · Release the tension slowly. Relax your neck muscles completely before you start the next repetition.  Repeat __________ times. Complete this exercise __________ times per day.    All of your joints have less wear and tear when properly supported by a spine with good posture. This means you will experience a healthier, less painful body.  Correct posture must be practiced with all of your activities, especially prolonged sitting and standing. Correct posture is as important when doing repetitive low-stress activities (typing) as it is when doing a single heavy-load activity (lifting). PROLONGED STANDING WHILE SLIGHTLY LEANING FORWARD When completing a task that requires you to lean forward while standing in one  place for a long time, place either foot up on a stationary 2- to 4-inch high object to help maintain the best posture. When both feet are on the ground, the low back tends to lose its slight inward curve. If this curve flattens (or becomes too large), then the back and your other joints will experience too much stress, fatigue more quickly, and can cause pain.  RESTING POSITIONS Consider which positions are most painful for you when choosing a resting position. If you have pain with flexion-based activities (sitting, bending, stooping, squatting), choose a position that allows you to rest in a less flexed posture. You would want to avoid curling into a fetal position on your side. If your pain worsens with extension-based activities (prolonged standing, working overhead), avoid resting in an extended position such as sleeping on your stomach. Most people will find more comfort when they rest with their spine in a more neutral position, neither too rounded nor too arched. Lying on a non-sagging bed on your side with a pillow between your knees, or on your back with a pillow under your knees will often provide some relief. Keep in mind, being in any one position for a prolonged period of time, no matter how correct your posture, can still lead to stiffness. WALKING Walk with an upright posture. Your ears, shoulders, and hips should all line up. OFFICE WORK When working at a desk, create an environment that supports good, upright posture. Without extra support, muscles fatigue and lead to excessive strain on joints and other tissues. CHAIR:  A chair should be able to slide under your desk when your back makes contact with the back of the chair. This allows you to work closely.  The chair's height should allow your eyes to be level with the upper part of your monitor and your hands to be slightly lower than your elbows.  Body position:  Your feet should make contact with the floor. If this is not  possible, use a foot rest.  Keep your ears over your shoulders. This will reduce stress on your neck and low back.   This information is not intended to replace advice given to you by your health care provider. Make sure you discuss any questions you have with your health care provider.   Document Released: 06/19/2005 Document Revised: 07/10/2014 Document Reviewed: 10/01/2008 Elsevier Interactive Patient Education 2016 [employer] Radicular Pain Radicular pain in either the arm or leg is usually from a bulging or herniated disk in the spine. A piece of the herniated disk may press against the nerves as the nerves exit the spine. This causes pain which is felt at the tips of the nerves down the arm or leg. Other causes of radicular pain may include:  Fractures.  Heart disease.  Cancer.  An abnormal and usually degenerative state of the nervous system or nerves (neuropathy). Diagnosis may require CT or MRI scanning to determine the primary cause.  Nerves that start at the neck (nerve roots)  may cause radicular pain in the outer shoulder and arm. It can spread down to the thumb and fingers. The symptoms vary depending on which nerve root has been affected. In most cases radicular pain improves with conservative treatment. Neck problems may require physical therapy, a neck collar, or cervical traction. Treatment may take many weeks, and surgery may be considered if the symptoms do not improve.  Conservative treatment is also recommended for sciatica. Sciatica causes pain to radiate from the lower back or buttock area down the leg into the foot. Often there is a history of back problems. Most patients with sciatica are better after 2 to 4 weeks of rest and other supportive care. Short term bed rest can reduce the disk pressure considerably. Sitting, however, is not a good position since this increases the pressure on the disk. You should avoid bending, lifting, and all other activities which make  the problem worse. Traction can be used in severe cases. Surgery is usually reserved for patients who do not improve within the first months of treatment. Only take over-the-counter or prescription medicines for pain, discomfort, or fever as directed by your caregiver. Narcotics and muscle relaxants may help by relieving more severe pain and spasm and by providing mild sedation. Cold or massage can give significant relief. Spinal manipulation is not recommended. It can increase the degree of disc protrusion. Epidural steroid injections are often effective treatment for radicular pain. These injections deliver medicine to the spinal nerve in the space between the protective covering of the spinal cord and back bones (vertebrae). Your caregiver can give you more information about steroid injections. These injections are most effective when given within two weeks of the onset of pain.  You should see your caregiver for follow up care as recommended. A program for neck and back injury rehabilitation with stretching and strengthening exercises is an important part of management.  SEEK IMMEDIATE MEDICAL CARE IF:  You develop increased pain, weakness, or numbness in your arm or leg.  You develop difficulty with bladder or bowel control.  You develop abdominal pain.   This information is not intended to replace advice given to you by your health care provider. Make sure you discuss any questions you have with your health care provider.   Document Released: 07/27/2004 Document Revised: 07/10/2014 Document Reviewed: 01/13/2015 Elsevier Interactive Patient Education [employer]
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
## 2016-01-12 NOTE — ED Notes (Signed)
Declined W/C at D/C and was escorted to lobby by RN. 
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
## 2016-01-14 ENCOUNTER — Ambulatory Visit (INDEPENDENT_AMBULATORY_CARE_PROVIDER_SITE_OTHER): Payer: Commercial Managed Care - HMO

## 2016-01-14 ENCOUNTER — Ambulatory Visit (INDEPENDENT_AMBULATORY_CARE_PROVIDER_SITE_OTHER): Payer: Commercial Managed Care - HMO | Admitting: Physician Assistant

## 2016-01-14 VITALS — BP 126/80 | HR 94 | Temp 98.0°F | Resp 18 | Ht 69.0 in | Wt 211.6 lb

## 2016-01-14 DIAGNOSIS — S161XXA Strain of muscle, fascia and tendon at neck level, initial encounter: Secondary | ICD-10-CM

## 2016-01-14 DIAGNOSIS — Z5181 Encounter for therapeutic drug level monitoring: Secondary | ICD-10-CM | POA: Diagnosis not present

## 2016-01-14 DIAGNOSIS — M25561 Pain in right knee: Secondary | ICD-10-CM

## 2016-01-14 MED ORDER — CYCLOBENZAPRINE HCL 10 MG PO TABS: 10.0000 mg | ORAL_TABLET | Freq: Three times a day (TID) | ORAL | Status: DC | PRN

## 2016-01-14 MED ORDER — MELOXICAM 7.5 MG PO TABS: 7.5000 mg | ORAL_TABLET | Freq: Every day | ORAL | Status: DC

## 2016-01-14 MED ORDER — CYCLOBENZAPRINE HCL 5 MG PO TABS: 5.0000 mg | ORAL_TABLET | Freq: Three times a day (TID) | ORAL | Status: DC | PRN

## 2016-01-14 NOTE — Patient Instructions (Addendum)
Your knee xray was normal today. I have referred you to both orthopedics and physical therapy for your knee and neck pain that is ongoing.  Please start taking the mobic daily once daily for the next 2-3 weeks. Be sure not to take other NSAIDs with this medicine.  I am checking your kidneys today to ensure this is ok.  I've refilled the flexeril today for spasms along the right cervical and lumbar spine. I recommend not taking this more than 1-2 times daily to ensure you aren't too drowsy.
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
## 2016-01-15 LAB — BASIC METABOLIC PANEL
BUN: 13 mg/dL (ref 7–25)
CALCIUM: 9.6 mg/dL (ref 8.6–10.3)
CHLORIDE: 104 mmol/L (ref 98–110)
CO2: 28 mmol/L (ref 20–31)
CREATININE: 1.19 mg/dL (ref 0.70–1.25)
GLUCOSE: 94 mg/dL (ref 65–99)
Potassium: 4.5 mmol/L (ref 3.5–5.3)
SODIUM: 139 mmol/L (ref 135–146)

## 2016-01-17 ENCOUNTER — Telehealth: Payer: Self-pay

## 2016-01-17 NOTE — Telephone Encounter (Signed)
Spoke with pharmacy

## 2016-01-17 NOTE — Telephone Encounter (Signed)
Pharmacy called to verify dosage of flexril, at first Todd did write for 10 then he cancelled and put in a new prescription for 5mg , I did speak with [place] and Joni Reiningicole and they both agreed that 5 is the correct dose

## 2016-01-17 NOTE — Telephone Encounter (Signed)
[phone]  Pharmacy states it has been 3 days since they tried to get the questions about the two medications that were sent in    Best number for pharmacy is [phone]

## 2016-05-12 ENCOUNTER — Ambulatory Visit: Payer: Commercial Managed Care - HMO

## 2016-06-13 ENCOUNTER — Ambulatory Visit (INDEPENDENT_AMBULATORY_CARE_PROVIDER_SITE_OTHER): Payer: Commercial Managed Care - HMO | Admitting: Family Medicine

## 2016-06-13 ENCOUNTER — Ambulatory Visit (INDEPENDENT_AMBULATORY_CARE_PROVIDER_SITE_OTHER): Payer: Commercial Managed Care - HMO

## 2016-06-13 DIAGNOSIS — M545 Low back pain, unspecified: Secondary | ICD-10-CM

## 2016-06-13 DIAGNOSIS — R0789 Other chest pain: Secondary | ICD-10-CM

## 2016-06-13 DIAGNOSIS — M542 Cervicalgia: Secondary | ICD-10-CM

## 2016-06-13 DIAGNOSIS — S0101XD Laceration without foreign body of scalp, subsequent encounter: Secondary | ICD-10-CM

## 2016-06-13 DIAGNOSIS — S22000A Wedge compression fracture of unspecified thoracic vertebra, initial encounter for closed fracture: Secondary | ICD-10-CM

## 2016-06-13 DIAGNOSIS — M25561 Pain in right knee: Secondary | ICD-10-CM

## 2016-06-13 MED ORDER — CYCLOBENZAPRINE HCL 5 MG PO TABS: ORAL_TABLET | ORAL | 0 refills | Status: DC

## 2016-06-13 MED ORDER — TRAMADOL HCL 50 MG PO TABS
50.0000 mg | ORAL_TABLET | Freq: Four times a day (QID) | ORAL | 0 refills | Status: DC | PRN
Start: 2016-06-13 — End: 2016-08-08
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
## 2016-06-16 ENCOUNTER — Ambulatory Visit (INDEPENDENT_AMBULATORY_CARE_PROVIDER_SITE_OTHER): Payer: Commercial Managed Care - HMO | Admitting: Family Medicine

## 2016-06-16 ENCOUNTER — Ambulatory Visit (INDEPENDENT_AMBULATORY_CARE_PROVIDER_SITE_OTHER): Payer: Commercial Managed Care - HMO

## 2016-06-16 DIAGNOSIS — S22080D Wedge compression fracture of T11-T12 vertebra, subsequent encounter for fracture with routine healing: Secondary | ICD-10-CM | POA: Diagnosis not present

## 2016-06-16 DIAGNOSIS — M25561 Pain in right knee: Secondary | ICD-10-CM

## 2016-06-16 DIAGNOSIS — R42 Dizziness and giddiness: Secondary | ICD-10-CM

## 2016-06-16 DIAGNOSIS — M542 Cervicalgia: Secondary | ICD-10-CM

## 2016-06-16 DIAGNOSIS — S0100XD Unspecified open wound of scalp, subsequent encounter: Secondary | ICD-10-CM
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
## 2016-07-07 ENCOUNTER — Encounter (INDEPENDENT_AMBULATORY_CARE_PROVIDER_SITE_OTHER): Payer: Self-pay | Admitting: Orthopaedic Surgery

## 2016-07-07 ENCOUNTER — Ambulatory Visit (INDEPENDENT_AMBULATORY_CARE_PROVIDER_SITE_OTHER): Payer: Medicare HMO | Admitting: Orthopaedic Surgery

## 2016-07-07 ENCOUNTER — Ambulatory Visit (INDEPENDENT_AMBULATORY_CARE_PROVIDER_SITE_OTHER): Payer: Medicare HMO

## 2016-07-07 DIAGNOSIS — M545 Low back pain, unspecified: Secondary | ICD-10-CM

## 2016-07-07 DIAGNOSIS — M542 Cervicalgia: Secondary | ICD-10-CM

## 2016-07-07 DIAGNOSIS — M25561 Pain in right knee: Secondary | ICD-10-CM

## 2016-07-07 MED ORDER — DICLOFENAC SODIUM 75 MG PO TBEC: 75.0000 mg | DELAYED_RELEASE_TABLET | Freq: Two times a day (BID) | ORAL | 2 refills | Status: DC

## 2016-07-07 MED ORDER — BACLOFEN 10 MG PO TABS: 10.0000 mg | ORAL_TABLET | Freq: Three times a day (TID) | ORAL | 2 refills | Status: DC | PRN
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
## 2016-07-26 DIAGNOSIS — R072 Precordial pain: Secondary | ICD-10-CM | POA: Diagnosis not present

## 2016-07-26 DIAGNOSIS — R9430 Abnormal result of cardiovascular function study, unspecified: Secondary | ICD-10-CM | POA: Diagnosis not present

## 2016-07-26 DIAGNOSIS — R0602 Shortness of breath: Secondary | ICD-10-CM | POA: Diagnosis not present

## 2016-08-08 ENCOUNTER — Ambulatory Visit (INDEPENDENT_AMBULATORY_CARE_PROVIDER_SITE_OTHER): Payer: Medicare HMO | Admitting: Family Medicine

## 2016-08-08 VITALS — BP 130/80 | HR 70 | Temp 98.1°F | Resp 17 | Ht 70.5 in | Wt 213.0 lb

## 2016-08-08 DIAGNOSIS — S0100XD Unspecified open wound of scalp, subsequent encounter: Secondary | ICD-10-CM | POA: Diagnosis not present

## 2016-08-08 DIAGNOSIS — M542 Cervicalgia: Secondary | ICD-10-CM | POA: Diagnosis not present

## 2016-08-08 DIAGNOSIS — L02811 Cutaneous abscess of head [any part, except face]: Secondary | ICD-10-CM

## 2016-08-08 MED ORDER — DOXYCYCLINE HYCLATE 100 MG PO TABS: 100.0000 mg | ORAL_TABLET | Freq: Two times a day (BID) | ORAL | 0 refills | Status: DC

## 2016-08-08 NOTE — Patient Instructions (Addendum)
You have a small infection in the area of the wound on your scalp. This should improve with antibiotics, soap and water over area with gentle pressure to express pus once per day. If any increased swelling, redness, or worsening pain in the next week to 10 days, return for recheck. Otherwise return in 10 days if that area has not closed up and healed.   Skin Abscess A skin abscess is an infected area on or under your skin that contains a collection of pus and other material. An abscess may also be called a furuncle, carbuncle, or boil. An abscess can occur in or on almost any part of your body. Some abscesses break open (rupture) on their own. Most continue to get worse unless they are treated. The infection can spread deeper into the body and eventually into your blood, which can make you feel ill. Treatment usually involves draining the abscess. What are the causes? An abscess occurs when germs, often bacteria, pass through your skin and cause an infection. This may be caused by:  A scrape or cut on your skin.  A puncture wound through your skin, including a needle injection.  Blocked oil or sweat glands.  Blocked and infected hair follicles.  A cyst that forms beneath your skin (sebaceous cyst) and becomes infected. What increases the risk? This condition is more likely to develop in people who:  Have a weak body defense system (immune system).  Have diabetes.  Have dry and irritated skin.  Get frequent injections or use illegal IV drugs.  Have a foreign body in a wound, such as a splinter.  Have problems with their lymph system or veins. What are the signs or symptoms? An abscess may start as a painful, firm bump under the skin. Over time, the abscess may get larger or become softer. Pus may appear at the top of the abscess, causing pressure and pain. It may eventually break through the skin and drain. Other symptoms  include:  Redness.  Warmth.  Swelling.  Tenderness.  A sore on the skin. How is this diagnosed? This condition is diagnosed based on your medical history and a physical exam. A sample of pus may be taken from the abscess to find out what is causing the infection and what antibiotics can be used to treat it. You also may have:  Blood tests to look for signs of infection or spread of an infection to your blood.  Imaging studies such as ultrasound, CT scan, or MRI if the abscess is deep. How is this treated? Small abscesses that drain on their own may not need treatment. Treatment for an abscess that does not rupture on its own may include:  Warm compresses applied to the area several times per day.  Incision and drainage. Your health care provider will make an incision to open the abscess and will remove pus and any foreign body or dead tissue. The incision area may be packed with gauze to keep it open for a few days while it heals.  Antibiotic medicines to treat infection. For a severe abscess, you may first get antibiotics through an IV and then change to oral antibiotics. Follow these instructions at home: Abscess Care  If you have an abscess that has not drained, place a warm, clean, wet washcloth over the abscess several times a day. Do this as told by your health care provider.  Follow instructions from your health care provider about how to take care of your abscess. Make sure  you:  Cover the abscess with a bandage (dressing).  Change your dressing or gauze as told by your health care provider.  Wash your hands with soap and water before you change the dressing or gauze. If soap and water are not available, use hand sanitizer.  Check your abscess every day for signs of a worsening infection. Check for:  More redness, swelling, or pain.  More fluid or blood.  Warmth.  More pus or a bad smell. Medicines  Take over-the-counter and prescription medicines only as told  by your health care provider.  If you were prescribed an antibiotic medicine, take it as told by your health care provider. Do not stop taking the antibiotic even if you start to feel better. General instructions  To avoid spreading the infection:  Do not share personal care items, towels, or hot tubs with others.  Avoid making skin contact with other people.  Keep all follow-up visits as told by your health care provider. This is important. Contact a health care provider if:  You have more redness, swelling, or pain around your abscess.  You have more fluid or blood coming from your abscess.  Your abscess feels warm to the touch.  You have more pus or a bad smell coming from your abscess.  You have a fever.  You have muscle aches.  You have chills or a general ill feeling. Get help right away if:  You have severe pain.  You see red streaks on your skin spreading away from the abscess. This information is not intended to replace advice given to you by your health care provider. Make sure you discuss any questions you have with your health care provider. Document Released: 03/29/2005 Document Revised: 02/13/2016 Document Reviewed: 04/28/2015 Elsevier Interactive Patient Education  2017 [employer].     IF you received an x-ray today, you will receive an invoice from [facility]. Please contact [facility] at [phone] with questions or concerns regarding your invoice.   IF you received labwork today, you will receive an invoice from [place]. Please contact [facility] at [phone] with questions or concerns regarding your invoice.   Our billing staff will not be able to assist you with questions regarding bills from these companies.  You will be contacted with the lab results as soon as they are available. The fastest way to get your results is to activate your My Chart account. Instructions are located on the last page of this paperwork. If you  have not heard from [place] regarding the results in 2 weeks, please contact this office.
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
## 2016-08-12 LAB — WOUND CULTURE

## 2016-08-31 ENCOUNTER — Telehealth (INDEPENDENT_AMBULATORY_CARE_PROVIDER_SITE_OTHER): Payer: Self-pay | Admitting: *Deleted
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
## 2016-09-06 NOTE — Telephone Encounter (Signed)
Unable to contact pt. 

## 2016-09-15 DIAGNOSIS — Z79899 Other long term (current) drug therapy: Secondary | ICD-10-CM | POA: Diagnosis not present

## 2016-09-15 DIAGNOSIS — E559 Vitamin D deficiency, unspecified: Secondary | ICD-10-CM | POA: Diagnosis not present

## 2016-09-15 DIAGNOSIS — E785 Hyperlipidemia, unspecified: Secondary | ICD-10-CM | POA: Diagnosis not present

## 2016-09-15 DIAGNOSIS — E876 Hypokalemia: Secondary | ICD-10-CM | POA: Diagnosis not present

## 2016-09-15 DIAGNOSIS — N429 Disorder of prostate, unspecified: Secondary | ICD-10-CM | POA: Diagnosis not present

## 2016-09-15 DIAGNOSIS — D649 Anemia, unspecified: Secondary | ICD-10-CM | POA: Diagnosis not present

## 2016-09-20 DIAGNOSIS — R072 Precordial pain: Secondary | ICD-10-CM | POA: Diagnosis not present

## 2016-09-20 DIAGNOSIS — R0602 Shortness of breath: Secondary | ICD-10-CM | POA: Diagnosis not present

## 2016-09-20 DIAGNOSIS — R9430 Abnormal result of cardiovascular function study, unspecified: Secondary | ICD-10-CM | POA: Diagnosis not present

## 2017-04-04 ENCOUNTER — Telehealth: Payer: Self-pay | Admitting: Family Medicine
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
## 2017-04-04 NOTE — Telephone Encounter (Signed)
Routed to Dr. Greene 
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
## 2017-04-05 ENCOUNTER — Encounter: Payer: Self-pay | Admitting: *Deleted

## 2017-04-05 NOTE — Telephone Encounter (Signed)
Lm to call back

## 2017-04-05 NOTE — Telephone Encounter (Signed)
Lm message to call back about letter

## 2017-04-18 ENCOUNTER — Telehealth: Payer: Self-pay | Admitting: Family Medicine

## 2017-04-18 NOTE — Telephone Encounter (Signed)
Pt would like a refill on his cyclobenzaprine (FLEXERIL) 5 MG tablet [[id number]][[id number]] for pain. He is having trouble sleeping. Please advise at [phone]

## 2017-04-23 NOTE — Telephone Encounter (Signed)
Please advise 
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
## 2017-04-24 NOTE — Telephone Encounter (Signed)
Advised pt. He will schedule OV.

## 2017-04-26 ENCOUNTER — Ambulatory Visit: Payer: Medicare HMO | Admitting: Family Medicine

## 2017-04-30 ENCOUNTER — Ambulatory Visit: Payer: Medicare HMO | Admitting: Family Medicine

## 2017-05-11 ENCOUNTER — Ambulatory Visit (INDEPENDENT_AMBULATORY_CARE_PROVIDER_SITE_OTHER): Payer: Medicare HMO

## 2017-05-11 ENCOUNTER — Encounter: Payer: Self-pay | Admitting: Urgent Care

## 2017-05-11 ENCOUNTER — Ambulatory Visit (INDEPENDENT_AMBULATORY_CARE_PROVIDER_SITE_OTHER): Payer: Medicare HMO | Admitting: Urgent Care

## 2017-05-11 VITALS — BP 134/71 | HR 60 | Temp 97.8°F | Resp 18 | Ht 70.5 in | Wt 212.8 lb

## 2017-05-11 DIAGNOSIS — G8929 Other chronic pain: Secondary | ICD-10-CM

## 2017-05-11 DIAGNOSIS — M25521 Pain in right elbow: Secondary | ICD-10-CM | POA: Diagnosis not present

## 2017-05-11 DIAGNOSIS — M25511 Pain in right shoulder: Secondary | ICD-10-CM | POA: Diagnosis not present

## 2017-05-11 DIAGNOSIS — M62838 Other muscle spasm: Secondary | ICD-10-CM

## 2017-05-11 MED ORDER — MELOXICAM 7.5 MG PO TABS: 7.5000 mg | ORAL_TABLET | Freq: Every day | ORAL | 1 refills | Status: DC

## 2017-05-11 MED ORDER — CYCLOBENZAPRINE HCL 10 MG PO TABS: 10.0000 mg | ORAL_TABLET | Freq: Three times a day (TID) | ORAL | 1 refills | Status: DC | PRN
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
## 2017-05-11 NOTE — Patient Instructions (Addendum)
Shoulder Pain Many things can cause shoulder pain, including:  An injury.  Moving the arm in the same way again and again (overuse).  Joint pain (arthritis).  Follow these instructions at home: Take these actions to help with your pain:  Squeeze a soft ball or a foam pad as much as you can. This helps to prevent swelling. It also makes the arm stronger.  Take over-the-counter and prescription medicines only as told by your doctor.  If told, put ice on the area: ? Put ice in a plastic bag. ? Place a towel between your skin and the bag. ? Leave the ice on for 20 minutes, 2-3 times per day. Stop putting on ice if it does not help with the pain.  If you were given a shoulder sling or immobilizer: ? Wear it as told. ? Remove it to shower or bathe. ? Move your arm as little as possible. ? Keep your hand moving. This helps prevent swelling.  Contact a doctor if:  Your pain gets worse.  Medicine does not help your pain.  You have new pain in your arm, hand, or fingers. Get help right away if:  Your arm, hand, or fingers: ? Tingle. ? Are numb. ? Are swollen. ? Are painful. ? Turn white or blue. This information is not intended to replace advice given to you by your health care provider. Make sure you discuss any questions you have with your health care provider. Document Released: 12/06/2007 Document Revised: 02/13/2016 Document Reviewed: 10/12/2014 Elsevier Interactive Patient Education  2018 [employer]     IF you received an x-ray today, you will receive an invoice from [facility]. Please contact [facility] at [phone] with questions or concerns regarding your invoice.   IF you received labwork today, you will receive an invoice from [facility]. Please contact [facility] at [phone] with questions or concerns regarding your invoice.   Our billing staff will not be able to assist you with questions regarding bills from these companies.  You  will be contacted with the lab results as soon as they are available. The fastest way to get your results is to activate your My Chart account. Instructions are located on the last page of this paperwork. If you have not heard from us rega rding the results in 2 weeks, please contact this office.

## 2017-05-14 ENCOUNTER — Telehealth: Payer: Self-pay

## 2017-05-14 NOTE — Telephone Encounter (Signed)
error 

## 2017-05-15 ENCOUNTER — Telehealth: Payer: Self-pay

## 2017-05-15 NOTE — Telephone Encounter (Signed)
Received letter stating that cyclobenzaprine HCL is approved from 07/01/2016-07/02/2017.

## 2017-05-25 ENCOUNTER — Ambulatory Visit (INDEPENDENT_AMBULATORY_CARE_PROVIDER_SITE_OTHER): Payer: Medicare HMO | Admitting: Family Medicine

## 2017-05-25 ENCOUNTER — Encounter: Payer: Self-pay | Admitting: Family Medicine

## 2017-05-25 ENCOUNTER — Other Ambulatory Visit: Payer: Self-pay

## 2017-05-25 VITALS — BP 130/70 | HR 66 | Temp 97.6°F | Resp 16 | Ht 69.69 in | Wt 210.0 lb

## 2017-05-25 DIAGNOSIS — M5030 Other cervical disc degeneration, unspecified cervical region: Secondary | ICD-10-CM

## 2017-05-25 DIAGNOSIS — Z1211 Encounter for screening for malignant neoplasm of colon: Secondary | ICD-10-CM

## 2017-05-25 DIAGNOSIS — M542 Cervicalgia: Secondary | ICD-10-CM

## 2017-05-25 DIAGNOSIS — M5412 Radiculopathy, cervical region: Secondary | ICD-10-CM

## 2017-05-25 MED ORDER — PREDNISONE 20 MG PO TABS: ORAL_TABLET | ORAL | 0 refills | Status: DC
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
## 2017-05-25 NOTE — Patient Instructions (Addendum)
Neck pain is related likely to arthritis. Stop meloxicam, start prednisone taper. Okay to continue the muscle relaxant cyclobenzaprine while you are taking prednisone. I will refer you to orthopedics to look into other treatment options.Return to the clinic or go to the nearest emergency room if any of your symptoms worsen or new symptoms occur.  I place a referral to gastroenterology for colonoscopy as requested, but please discuss other health maintenance and cancer screening with your primary care provider.  Thanks for coming in today.    Cervical Radiculopathy Cervical radiculopathy means that a nerve in the neck is pinched or bruised. This can cause pain or loss of feeling (numbness) that runs from your neck to your arm and fingers. Follow these instructions at home: Managing pain  Take over-the-counter and prescription medicines only as told by your doctor.  If directed, put ice on the injured or painful area. ? Put ice in a plastic bag. ? Place a towel between your skin and the bag. ? Leave the ice on for 20 minutes, 2-3 times per day.  If ice does not help, you can try using heat. Take a warm shower or warm bath, or use a heat pack as told by your doctor.  You may try a gentle neck and shoulder massage. Activity  Rest as needed. Follow instructions from your doctor about any activities to avoid.  Do exercises as told by your doctor or physical therapist. General instructions  If you were given a soft collar, wear it as told by your doctor.  Use a flat pillow when you sleep.  Keep all follow-up visits as told by your doctor. This is important. Contact a doctor if:  Your condition does not improve with treatment. Get help right away if:  Your pain gets worse and is not controlled with medicine.  You lose feeling or feel weak in your hand, arm, face, or leg.  You have a fever.  You have a stiff neck.  You cannot control when you poop or pee (have  incontinence).  You have trouble with walking, balance, or talking. This information is not intended to replace advice given to you by your health care provider. Make sure you discuss any questions you have with your health care provider. Document Released: 06/08/2011 Document Revised: 11/25/2015 Document Reviewed: 08/13/2014 Elsevier Interactive Patient Education  2018 [employer]    IF you received an x-ray today, you will receive an invoice from [facility]. Please contact [facility] at [phone] with questions or concerns regarding your invoice.   IF you received labwork today, you will receive an invoice from [facility]. Please contact [facility] at [phone] with questions or concerns regarding your invoice.   Our billing staff will not be able to assist you with questions regarding bills from these companies.  You will be contacted with the lab results as soon as they are available. The fastest way to get your results is to activate your My Chart account. Instructions are located on the last page of this paperwork. If you have not heard from us regarding the results in 2 weeks, please contact this office.

## 2017-06-04 ENCOUNTER — Encounter: Payer: Self-pay | Admitting: Gastroenterology

## 2017-07-06 ENCOUNTER — Ambulatory Visit (INDEPENDENT_AMBULATORY_CARE_PROVIDER_SITE_OTHER): Payer: Medicare HMO | Admitting: Orthopaedic Surgery

## 2017-07-06 ENCOUNTER — Encounter (INDEPENDENT_AMBULATORY_CARE_PROVIDER_SITE_OTHER): Payer: Self-pay | Admitting: Orthopaedic Surgery

## 2017-07-06 VITALS — BP 150/77 | HR 87 | Ht 69.0 in | Wt 212.0 lb

## 2017-07-06 DIAGNOSIS — M47812 Spondylosis without myelopathy or radiculopathy, cervical region: Secondary | ICD-10-CM | POA: Diagnosis not present

## 2017-07-07 ENCOUNTER — Other Ambulatory Visit: Payer: Self-pay | Admitting: Urgent Care

## 2017-07-13 ENCOUNTER — Encounter: Payer: Self-pay | Admitting: Family Medicine

## 2017-07-16 ENCOUNTER — Encounter (INDEPENDENT_AMBULATORY_CARE_PROVIDER_SITE_OTHER): Payer: Self-pay | Admitting: Orthopaedic Surgery
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
## 2017-07-27 ENCOUNTER — Encounter: Payer: Commercial Managed Care - HMO | Admitting: Gastroenterology

## 2017-10-27 ENCOUNTER — Other Ambulatory Visit: Payer: Self-pay

## 2017-10-27 ENCOUNTER — Encounter: Payer: Self-pay | Admitting: Family Medicine

## 2017-10-27 ENCOUNTER — Ambulatory Visit (INDEPENDENT_AMBULATORY_CARE_PROVIDER_SITE_OTHER): Payer: Medicare HMO | Admitting: Family Medicine

## 2017-10-27 VITALS — BP 136/72 | HR 72 | Temp 98.0°F | Resp 16 | Ht 69.0 in | Wt 208.0 lb

## 2017-10-27 DIAGNOSIS — M25561 Pain in right knee: Secondary | ICD-10-CM | POA: Diagnosis not present

## 2017-10-27 MED ORDER — MELOXICAM 15 MG PO TABS: 15.0000 mg | ORAL_TABLET | Freq: Every day | ORAL | 0 refills | Status: DC

## 2017-10-27 NOTE — Patient Instructions (Addendum)
Follow up with [facility] for right knee arthritis next week for evaluation for knee injection  Address: [address] [place], [place] [address]   Phone: [phone]   IF you received an x-ray today, you will receive an invoice from [facility]. Please contact [facility] at [phone] with questions or concerns regarding your invoice.   IF you received labwork today, you will receive an invoice from [facility]. Please contact [facility] at [phone] with questions or concerns regarding your invoice.   Our billing staff will not be able to assist you with questions regarding bills from these companies.  You will be contacted with the lab results as soon as they are available. The fastest way to get your results is to activate your My Chart account. Instructions are located on the last page of this paperwork. If you have not heard from [place] regarding the results in 2 weeks, please contact this office.      Knee Pain, Adult Knee pain in adults is common. It can be caused by many things, including:  Arthritis.  A fluid-filled sac (cyst) or growth in your knee.  An infection in your knee.  An injury that will not heal.  Damage, swelling, or irritation of the tissues that support your knee.  Knee pain is usually not a sign of a serious problem. The pain may go away on its own with time and rest. If it does not, a health care provider may order tests to find the cause of the pain. These may include:  Imaging tests, such as an X-ray, MRI, or ultrasound.  Joint aspiration. In this test, fluid is removed from the knee.  Arthroscopy. In this test, a lighted tube is inserted into knee and an image is projected onto a TV screen.  A biopsy. In this test, a sample of tissue is removed from the body and studied under a microscope.  Follow these instructions at home: Pay attention to any changes in your symptoms. Take these actions to relieve your  pain. Activity  Rest your knee.  Do not do things that cause pain or make pain worse.  Avoid high-impact activities or exercises, such as running, jumping rope, or doing jumping jacks. General instructions  Take over-the-counter and prescription medicines only as told by your health care provider.  Raise (elevate) your knee above the level of your heart when you are sitting or lying down.  Sleep with a pillow under your knee.  If directed, apply ice to the knee: ? Put ice in a plastic bag. ? Place a towel between your skin and the bag. ? Leave the ice on for 20 minutes, 2-3 times a day.  Ask your health care provider if you should wear an elastic knee support.  Lose weight if you are overweight. Extra weight can put pressure on your knee.  Do not use any products that contain nicotine or tobacco, such as cigarettes and e-cigarettes. Smoking may slow the healing of any bone and joint problems that you may have. If you need help quitting, ask your health care provider. Contact a health care provider if:  Your knee pain continues, changes, or gets worse.  You have a fever along with knee pain.  Your knee buckles or locks up.  Your knee swells, and the swelling becomes worse. Get help right away if:  Your knee feels warm to the touch.  You cannot move your knee.  You have severe pain in your knee.  You have  chest pain.  You have trouble breathing. Summary  Knee pain in adults is common. It can be caused by many things, including, arthritis, infection, cysts, or injury.  Knee pain is usually not a sign of a serious problem, but if it does not go away, a health care provider may perform tests to know the cause of the pain.  Pay attention to any changes in your symptoms. Relieve your pain with rest, medicines, light activity, and use of ice.  Get help if your pain continues or becomes very severe, or if your knee buckles or locks up, or if you have chest pain or trouble  breathing. This information is not intended to replace advice given to you by your health care provider. Make sure you discuss any questions you have with your health care provider. Document Released: 04/16/2007 Document Revised: 06/09/2016 Document Reviewed: 06/09/2016 Elsevier Interactive Patient Education  [employer].
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
## 2017-11-24 ENCOUNTER — Other Ambulatory Visit: Payer: Self-pay | Admitting: Family Medicine

## 2017-11-27 NOTE — Telephone Encounter (Signed)
Meloxicam refill Last OV: 10/27/17 #30 Last Refill:10/27/17 Pharmacy: [facility] [address] [facility] [facility]

## 2017-11-28 NOTE — Telephone Encounter (Signed)
Please advise. Dgaddy, CMA 

## 2018-06-10 ENCOUNTER — Telehealth: Payer: Self-pay | Admitting: Family Medicine

## 2018-06-10 NOTE — Telephone Encounter (Signed)
Copied from CRM [id number]. Topic: Appointment Scheduling - Scheduling Inquiry for Clinic >> Jun 10, 2018  3:08 PM Stephannie LiSimmons, Adelynne Joerger L, [place] wrote: Reason for CRM: Patient called and said he has a hernia and was told by Dr Chilton SiGreen if it gets any bigger to come see him there are no appointments available until January , and he is only able to come in on a Friday , please call to schedule  [phone]

## 2018-06-14 ENCOUNTER — Telehealth: Payer: Self-pay | Admitting: Family Medicine

## 2018-06-14 NOTE — Telephone Encounter (Signed)
Please schedule him an appointment.

## 2018-06-14 NOTE — Telephone Encounter (Signed)
LVM for pt to call the office and make an appt (per Dr. Neva SeatGreene) You will need to call [place] and ask for Kenney Housemananya so I may use a sameday. Thank you!

## 2018-08-22 ENCOUNTER — Other Ambulatory Visit: Payer: Self-pay

## 2018-08-22 ENCOUNTER — Emergency Department (HOSPITAL_COMMUNITY)
Admission: EM | Admit: 2018-08-22 | Discharge: 2018-08-22 | Disposition: A | Discharge status: Home / Self Care | Payer: Medicare HMO | Attending: Emergency Medicine | Admitting: Emergency Medicine

## 2018-08-22 ENCOUNTER — Encounter (HOSPITAL_COMMUNITY): Payer: Self-pay | Admitting: Emergency Medicine

## 2018-08-22 DIAGNOSIS — I10 Essential (primary) hypertension: Secondary | ICD-10-CM | POA: Insufficient documentation

## 2018-08-22 DIAGNOSIS — Z7982 Long term (current) use of aspirin: Secondary | ICD-10-CM | POA: Insufficient documentation

## 2018-08-22 DIAGNOSIS — Z87891 Personal history of nicotine dependence: Secondary | ICD-10-CM | POA: Insufficient documentation

## 2018-08-22 DIAGNOSIS — Z79899 Other long term (current) drug therapy: Secondary | ICD-10-CM | POA: Insufficient documentation

## 2018-08-22 DIAGNOSIS — K4090 Unilateral inguinal hernia, without obstruction or gangrene, not specified as recurrent: Secondary | ICD-10-CM | POA: Diagnosis not present

## 2018-08-22 DIAGNOSIS — J209 Acute bronchitis, unspecified: Secondary | ICD-10-CM | POA: Insufficient documentation

## 2018-08-22 DIAGNOSIS — R05 Cough: Secondary | ICD-10-CM | POA: Diagnosis present

## 2018-08-22 MED ORDER — BENZONATATE 100 MG PO CAPS: 100.0000 mg | ORAL_CAPSULE | Freq: Three times a day (TID) | ORAL | 0 refills | Status: DC

## 2018-08-22 MED ORDER — DOXYCYCLINE HYCLATE 100 MG PO CAPS: 100.0000 mg | ORAL_CAPSULE | Freq: Two times a day (BID) | ORAL | 0 refills | Status: DC

## 2018-08-22 NOTE — ED Triage Notes (Signed)
Pt reports cold symptoms since Tuesday, denies fevers. He also wants to get his inguinal hernia checked because when he coughs he has pain.

## 2018-08-22 NOTE — ED Notes (Signed)
Pt verbalized understanding of discharge instructions and denies any further questions at this time.   
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
## 2018-08-22 NOTE — Discharge Instructions (Addendum)
Call the general surgery team for follow up

## 2018-08-27 ENCOUNTER — Ambulatory Visit: Payer: Self-pay | Admitting: Surgery

## 2018-08-27 DIAGNOSIS — K4030 Unilateral inguinal hernia, with obstruction, without gangrene, not specified as recurrent: Secondary | ICD-10-CM | POA: Diagnosis not present

## 2018-08-28 ENCOUNTER — Ambulatory Visit: Payer: Self-pay | Admitting: Surgery

## 2018-08-28 NOTE — H&P (Signed)
History of Present Illness Austin Harrington. Magen Suriano MD; 08/28/2018 8:08 AM) The patient is a 68 year old male who presents with an inguinal hernia. Referred by Dr. Azalia Bilis for large right inguinal hernia PCP - Algie Coffer  This is a 68 year old male with history of coronary artery disease who presents with several years of an enlarging right inguinal hernia. The patient chose not to have this area examined and treated because of a difficult family situation. The hernia has become much larger and is very uncomfortable. He presented to the emergency department for evaluation. There were able partially reduced a hernia and they have referred him to our office for further evaluation. The patient reports some difficulty with bowel movements. The patient lives at home with just his granddaughter. He has limited assistance.   Allergies Santiago Glad, [place]; 08/27/2018 3:36 PM) No Known Drug Allergies [08/27/2018]: Allergies Reconciled  Medication History Santiago Glad, CMA; 08/27/2018 3:38 PM) Doxycycline Hyclate (100MG  Capsule, Oral) Active. amLODIPine Besy-Benazepril HCl (2.5-10MG  Capsule, Oral) Active. Aspirin (81MG  Tablet, Oral) Active. Benzonatate (100MG  Capsule, Oral) Active. Medications Reconciled  Other Problems Santiago Glad, CMA; 08/27/2018 3:35 PM) No pertinent past medical history     Review of Systems Santiago Glad CMA; 08/27/2018 3:35 PM) Gastrointestinal Not Present- Abdominal Pain, Bloating, Bloody Stool, Change in Bowel Habits, Chronic diarrhea, Constipation, Difficulty Swallowing, Excessive gas, Gets full quickly at meals, Hemorrhoids, Indigestion, Nausea, Rectal Pain and Vomiting.  Vitals Santiago Glad CMA; 08/27/2018 3:36 PM) 08/27/2018 3:36 PM Weight: 213 lb Height: 69in Body Surface Area: 2.12 m Body Mass Index: 31.45 kg/m  Temp.: 97.38F  Pulse: 64 (Regular)  BP: 150/74 (Sitting, Left Arm, Standard)      Physical Exam Molli Hazard K. Xyla Leisner  MD; 08/28/2018 8:09 AM)  The physical exam findings are as follows: Note:WDWN in NAD Eyes: Pupils equal, round; sclera anicteric HENT: Oral mucosa moist; good dentition Neck: No masses palpated, no thyromegaly Lungs: CTA bilaterally; normal respiratory effort CV: Regular rate and rhythm; no murmurs; extremities well-perfused with no edema Abd: +bowel sounds, soft, non-tender, no palpable organomegaly; no palpable hernias Large right groin bulge visible through the patient's clothes Bilateral descended testes; no testicular masses; massive right inguinal hernia extending down into the scrotum. Partially reducible; no sign of left inguinal hernia Skin: Warm, dry; no sign of jaundice Psychiatric - alert and oriented x 4; calm mood and affect    Assessment & Plan Molli Hazard K. Damiah Mcdonald MD; 08/27/2018 3:59 PM)  INCARCERATED RIGHT INGUINAL HERNIA (K40.30)  Current Plans Schedule for Surgery - Right inguinal hernia repair with mesh. The surgical procedure has been discussed with the patient. Potential risks, benefits, alternative treatments, and expected outcomes have been explained. All of the patient's questions at this time have been answered. The likelihood of reaching the patient's treatment goal is good. The patient understand the proposed surgical procedure and wishes to proceed.  Cardiac clearance by Dr. Algie Coffer first  Austin Harrington. Corliss Skains, MD, [facility] Surgery  General/ Trauma Surgery Beeper [phone]  08/28/2018 8:09 AM

## 2018-09-03 ENCOUNTER — Telehealth: Payer: Self-pay | Admitting: Cardiovascular Disease

## 2018-09-03 DIAGNOSIS — R072 Precordial pain: Secondary | ICD-10-CM | POA: Diagnosis not present

## 2018-09-03 DIAGNOSIS — R0602 Shortness of breath: Secondary | ICD-10-CM | POA: Diagnosis not present

## 2018-09-03 DIAGNOSIS — R05 Cough: Secondary | ICD-10-CM | POA: Diagnosis not present

## 2018-09-03 DIAGNOSIS — I10 Essential (primary) hypertension: Secondary | ICD-10-CM | POA: Diagnosis not present

## 2018-09-03 NOTE — Telephone Encounter (Signed)
Copied from CRM [id number]. Topic: Higher education careers adviser Patient (Clinic Use ONLY) >> Sep 03, 2018  5:17 PM Peace, Tammy L wrote: Left patient vm to call back to reschedule new pt appt with [facility] due to provider taking a position on outside of [facility].  Please transfer patient to office to reschedule.

## 2018-09-04 NOTE — Telephone Encounter (Signed)
Pt has been scheduled will Austin Harrington.

## 2018-09-13 NOTE — Pre-Procedure Instructions (Signed)
Austin Harrington  09/13/2018      [id number] [place], [place] - [phone] AT [facility] [address] [place] [address] Phone: [phone] Fax: [phone]    Your procedure is scheduled on March 25th.  Report to [facility] Entrance "A" Admitting at 5:30 A.M.  Call this number if you have problems the morning of surgery:  [phone]   Remember:  Do not eat after midnight.  You may drink clear liquids until 4:30 AM .  Clear liquids allowed are: Water, Juice (non-citric and without pulp), Carbonated beverages, Clear Tea, Black Coffee only, Plain Jell-O only and Gatorade    Take these medicines the morning of surgery with A SIP OF WATER   Amlodipine (Norvasc)  Atorvastatin (Lipitor)  Eye drops - if needed  Follow your surgeon's instructions on when to stop Asprin.  If no instructions were given by your surgeon then you will need to call the office to get those instructions.    7 days prior to surgery STOP taking any Aspirin (unless otherwise instructed by your surgeon), Aleve, Naproxen, Ibuprofen, Motrin, Advil, Goody's, BC's, all herbal medications, fish oil, and all vitamins.     Do not wear jewelry.  Do not wear lotions, powders, colognes, or deodorant.  Men may shave face and neck.  Do not bring valuables to the hospital.  [facility] is not responsible for any belongings or valuables.   [facility]- Preparing For Surgery  Before surgery, you can play an important role. Because skin is not sterile, your skin needs to be as free of germs as possible. You can reduce the number of germs on your skin by washing with CHG (chlorahexidine gluconate) Soap before surgery.  CHG is an antiseptic cleaner which kills germs and bonds with the skin to continue killing germs even after washing.    Oral Hygiene is also important to reduce your risk of infection.  Remember - BRUSH YOUR TEETH THE MORNING OF SURGERY WITH YOUR REGULAR  TOOTHPASTE  Please do not use if you have an allergy to CHG or antibacterial soaps. If your skin becomes reddened/irritated stop using the CHG.  Do not shave (including legs and underarms) for at least 48 hours prior to first CHG shower. It is OK to shave your face.  Please follow these instructions carefully.   1. Shower the NIGHT BEFORE SURGERY and the MORNING OF SURGERY with CHG.   2. If you chose to wash your hair, wash your hair first as usual with your normal shampoo.  3. After you shampoo, rinse your hair and body thoroughly to remove the shampoo.  4. Use CHG as you would any other liquid soap. You can apply CHG directly to the skin and wash gently with a scrungie or a clean washcloth.   5. Apply the CHG Soap to your body ONLY FROM THE NECK DOWN.  Do not use on open wounds or open sores. Avoid contact with your eyes, ears, mouth and genitals (private parts). Wash Face and genitals (private parts)  with your normal soap.  6. Wash thoroughly, paying special attention to the area where your surgery will be performed.  7. Thoroughly rinse your body with warm water from the neck down.  8. DO NOT shower/wash with your normal soap after using and rinsing off the CHG Soap.  9. Pat yourself dry with a CLEAN TOWEL.  10. Wear CLEAN PAJAMAS to bed the night before surgery, wear comfortable  clothes the morning of surgery  11. Place CLEAN SHEETS on your bed the night of your first shower and DO NOT SLEEP WITH PETS.   Day of Surgery:  Do not apply any deodorants/lotions.  Please wear clean clothes to the hospital/surgery center.   Remember to brush your teeth WITH YOUR REGULAR TOOTHPASTE.   Contacts, dentures or bridgework may not be worn into surgery.  Leave your suitcase in the car.  After surgery it may be brought to your room.  For patients admitted to the hospital, discharge time will be determined by your treatment team.  Patients discharged the day of surgery will not be  allowed to drive home.   Please read over the following fact sheets that you were given. Coughing and Deep Breathing and Surgical Site Infection Prevention

## 2018-09-16 ENCOUNTER — Encounter (HOSPITAL_COMMUNITY)
Admission: RE | Admit: 2018-09-16 | Discharge: 2018-09-16 | Disposition: A | Discharge status: Home / Self Care | Payer: Medicare HMO | Source: Ambulatory Visit | Attending: Surgery | Admitting: Surgery

## 2018-09-16 ENCOUNTER — Other Ambulatory Visit: Payer: Self-pay

## 2018-09-16 ENCOUNTER — Encounter (HOSPITAL_COMMUNITY): Payer: Self-pay

## 2018-09-16 ENCOUNTER — Encounter (HOSPITAL_COMMUNITY): Payer: Self-pay | Admitting: Physician Assistant

## 2018-09-16 DIAGNOSIS — Z01818 Encounter for other preprocedural examination: Secondary | ICD-10-CM | POA: Diagnosis not present

## 2018-09-16 HISTORY — DX: Unspecified osteoarthritis, unspecified site: M19.90

## 2018-09-16 LAB — CBC
HEMATOCRIT: 43.4 % (ref 39.0–52.0)
Hemoglobin: 13.9 g/dL (ref 13.0–17.0)
MCH: 29.4 pg (ref 26.0–34.0)
MCHC: 32 g/dL (ref 30.0–36.0)
MCV: 91.8 fL (ref 80.0–100.0)
Platelets: 231 10*3/uL (ref 150–400)
RBC: 4.73 MIL/uL (ref 4.22–5.81)
RDW: 11.8 % (ref 11.5–15.5)
WBC: 5.1 10*3/uL (ref 4.0–10.5)
nRBC: 0 % (ref 0.0–0.2)

## 2018-09-16 LAB — BASIC METABOLIC PANEL
Anion gap: 8 (ref 5–15)
BUN: 11 mg/dL (ref 8–23)
CO2: 23 mmol/L (ref 22–32)
Calcium: 9.5 mg/dL (ref 8.9–10.3)
Chloride: 107 mmol/L (ref 98–111)
Creatinine, Ser: 1.01 mg/dL (ref 0.61–1.24)
GFR calc Af Amer: >60 mL/min (ref >60)
GFR calc non Af Amer: >60 mL/min (ref >60)
Glucose, Bld: 90 mg/dL (ref 70–99)
Potassium: 4.1 mmol/L (ref 3.5–5.1)
Sodium: 138 mmol/L (ref 135–145)

## 2018-09-16 NOTE — Progress Notes (Addendum)
PCP -  Dr. [name] Cardiologist - Dr. [name]  Chest x-ray - N/A EKG - 09/16/2018 Stress Test -  Per pt. "Done at doctor's office about a year ago" - requested ECHO - Per pt. "Done at doctor's office this year"  - requested Cardiac Cath - 08/19/2015  Sleep Study - denies CPAP - N/A  Blood Thinner Instructions: N/A Aspirin Instructions: per pt. Instructions given by doctor to take up until day of surgery, LD to be 09/23/2017  Anesthesia review: Yes, heart hx, abnormal EKG  Patient denies shortness of breath, fever, cough and chest pain at PAT appointment  Patient verbalized understanding of instructions that were given to them at the PAT appointment. Patient was also instructed that they will need to review over the PAT instructions again at home before surgery.

## 2018-09-17 ENCOUNTER — Other Ambulatory Visit (HOSPITAL_COMMUNITY): Payer: Self-pay | Admitting: Surgery

## 2018-09-17 NOTE — Anesthesia Preprocedure Evaluation (Deleted)
Anesthesia Evaluation    Airway        Dental   Pulmonary former smoker,           Cardiovascular hypertension,      Neuro/Psych    GI/Hepatic   Endo/Other    Renal/GU      Musculoskeletal   Abdominal   Peds  Hematology   Anesthesia Other Findings   Reproductive/Obstetrics                             Anesthesia Physical Anesthesia Plan  ASA:   Anesthesia Plan:    Post-op Pain Management:    Induction:   PONV Risk Score and Plan:   Airway Management Planned:   Additional Equipment:   Intra-op Plan:   Post-operative Plan:   Informed Consent:   Plan Discussed with:   Anesthesia Plan Comments: (Pt had cath in 2017 due to abnormal stress test. Cath showed angiographically normal vessels, he was recommended medical treatment for non-cardiac chest pain and risk factors modification. Continues to follow with Dr. Algie Coffer. He has a known LBBB per cardiology notes. Cleared for surgery per OV note 09/03/18 stating "in absence of cardiac ischemia symptoms may undergo inguinal hernia surgery when cough is controlled." Pt recently treated for bronchitis, completed treatment >2wks ago. Denied symptoms at PAT.  Treadmill stress test 09/20/16 -the patient exercised according to the Bruce protocol, total time recorded 8 minutes 11 seconds.  Achieving max heart rate of 213 which was 138% of MPHR for age and 9.3 METS of work.  Baseline NIBP was 139/72.  Peak NIBP was 140/80.  Max systolic pressure was 190 max diastolic pressure was 96.  During exercise there were no significant ST-T ischemia changes. )       Anesthesia Quick Evaluation

## 2018-09-19 ENCOUNTER — Ambulatory Visit: Payer: Commercial Managed Care - HMO | Admitting: Nurse Practitioner

## 2018-09-20 ENCOUNTER — Ambulatory Visit: Payer: Commercial Managed Care - HMO | Admitting: Family

## 2018-09-25 ENCOUNTER — Encounter (HOSPITAL_COMMUNITY): Admission: RE | Payer: Self-pay | Source: Home / Self Care

## 2018-09-25 ENCOUNTER — Ambulatory Visit (HOSPITAL_COMMUNITY): Admission: RE | Admit: 2018-09-25 | Payer: Medicare HMO | Source: Home / Self Care | Admitting: Surgery

## 2018-09-25 SURGERY — REPAIR, HERNIA, INGUINAL, ADULT
Anesthesia: General | Laterality: Right

## 2018-12-18 ENCOUNTER — Ambulatory Visit: Payer: Self-pay | Admitting: Surgery

## 2018-12-27 ENCOUNTER — Encounter (HOSPITAL_COMMUNITY)
Admission: RE | Admit: 2018-12-27 | Discharge: 2018-12-27 | Disposition: A | Discharge status: Home / Self Care | Payer: Medicare HMO | Source: Ambulatory Visit | Attending: Surgery | Admitting: Surgery

## 2018-12-27 ENCOUNTER — Other Ambulatory Visit (HOSPITAL_COMMUNITY)
Admission: RE | Admit: 2018-12-27 | Discharge: 2018-12-27 | Disposition: A | Discharge status: Home / Self Care | Payer: Medicare HMO | Source: Ambulatory Visit | Attending: Surgery | Admitting: Surgery

## 2018-12-27 ENCOUNTER — Encounter (HOSPITAL_COMMUNITY): Payer: Self-pay

## 2018-12-27 ENCOUNTER — Other Ambulatory Visit: Payer: Self-pay

## 2018-12-27 DIAGNOSIS — Z1159 Encounter for screening for other viral diseases: Secondary | ICD-10-CM | POA: Insufficient documentation

## 2018-12-27 DIAGNOSIS — Z01812 Encounter for preprocedural laboratory examination: Secondary | ICD-10-CM | POA: Insufficient documentation

## 2018-12-27 LAB — SARS CORONAVIRUS 2 (TAT 6-24 HRS): SARS Coronavirus 2: NEGATIVE

## 2018-12-27 LAB — CBC
HCT: 40.5 % (ref 39.0–52.0)
Hemoglobin: 13.6 g/dL (ref 13.0–17.0)
MCH: 30.7 pg (ref 26.0–34.0)
MCHC: 33.6 g/dL (ref 30.0–36.0)
MCV: 91.4 fL (ref 80.0–100.0)
Platelets: 238 10*3/uL (ref 150–400)
RBC: 4.43 MIL/uL (ref 4.22–5.81)
RDW: 11.8 % (ref 11.5–15.5)
WBC: 5.9 10*3/uL (ref 4.0–10.5)
nRBC: 0 % (ref 0.0–0.2)

## 2018-12-27 LAB — BASIC METABOLIC PANEL
Anion gap: 9 (ref 5–15)
BUN: 13 mg/dL (ref 8–23)
CO2: 24 mmol/L (ref 22–32)
Calcium: 9.4 mg/dL (ref 8.9–10.3)
Chloride: 104 mmol/L (ref 98–111)
Creatinine, Ser: 0.93 mg/dL (ref 0.61–1.24)
GFR calc Af Amer: >60 mL/min (ref >60)
GFR calc non Af Amer: >60 mL/min (ref >60)
Glucose, Bld: 97 mg/dL (ref 70–99)
Potassium: 4.1 mmol/L (ref 3.5–5.1)
Sodium: 137 mmol/L (ref 135–145)

## 2018-12-27 NOTE — Progress Notes (Signed)
[id number] [address], [place] [address] Phone: [phone] Fax: [phone]      Your procedure is scheduled on June 30th.  Report to [facility] Main Entrance "A" at 7:30 A.M., and check in at the Admitting office.  Call this number if you have problems the morning of surgery:  [phone]  Call [phone] if you have any questions prior to your surgery date Monday-Friday 8am-4pm    Remember:  Do not eat or drink after midnight.    Take these medicines the morning of surgery with A SIP OF WATER   Amlodipine (Norvasc)  Atorvastatin (Lipitor)  Eye drops - if needed  Follow your surgeon's instructions on when to stop Aspirin.  If no instructions were given by your surgeon then you will need to call the office to get those instructions.    7 days prior to surgery STOP taking any Aleve, Naproxen, Ibuprofen, Motrin, Advil, Goody's, BC's, all herbal medications, fish oil, and all vitamins.    The Morning of Surgery  Do not wear jewelry.  Do not wear lotions, powders, colognes, or deodorant  Men may shave face and neck.  Do not bring valuables to the hospital.  [facility] is not responsible for any belongings or valuables.  If you are a smoker, DO NOT Smoke 24 hours prior to surgery IF you wear a CPAP at night please bring your mask, tubing, and machine the morning of surgery   Remember that you must have someone to transport you home after your surgery, and remain with you for 24 hours if you are discharged the same day.   Contacts, glasses, hearing aids, dentures or bridgework may not be worn into surgery.    Leave your suitcase in the car.  After surgery it may be brought to your room.  For patients admitted to the hospital, discharge time will be determined by your treatment team.  Patients discharged the day of surgery will not be allowed to drive home.     Special instructions:   [place]- Preparing For Surgery  Before surgery, you can play an important role. Because skin is not sterile, your skin needs to be as free of germs as possible. You can reduce the number of germs on your skin by washing with CHG (chlorahexidine gluconate) Soap before surgery.  CHG is an antiseptic cleaner which kills germs and bonds with the skin to continue killing germs even after washing.    Oral Hygiene is also important to reduce your risk of infection.  Remember - BRUSH YOUR TEETH THE MORNING OF SURGERY WITH YOUR REGULAR TOOTHPASTE  Please do not use if you have an allergy to CHG or antibacterial soaps. If your skin becomes reddened/irritated stop using the CHG.  Do not shave (including legs and underarms) for at least 48 hours prior to first CHG shower. It is OK to shave your face.  Please follow these instructions carefully.   1. Shower the NIGHT BEFORE SURGERY and the MORNING OF SURGERY with CHG Soap.   2. If you chose to wash your hair, wash your hair first as usual with your normal shampoo.  3. After you shampoo, rinse your hair and body thoroughly to remove the shampoo.  4. Use CHG as you would any other liquid soap. You can apply CHG directly to the skin and wash gently with a scrungie or a clean washcloth.   5. Apply  the CHG Soap to your body ONLY FROM THE NECK DOWN.  Do not use on open wounds or open sores. Avoid contact with your eyes, ears, mouth and genitals (private parts). Wash Face and genitals (private parts)  with your normal soap.   6. Wash thoroughly, paying special attention to the area where your surgery will be performed.  7. Thoroughly rinse your body with warm water from the neck down.  8. DO NOT shower/wash with your normal soap after using and rinsing off the CHG Soap.  9. Pat yourself dry with a CLEAN TOWEL.  10. Wear CLEAN PAJAMAS to bed the night before surgery, wear comfortable clothes the morning of  surgery  11. Place CLEAN SHEETS on your bed the night of your first shower and DO NOT SLEEP WITH PETS.    Day of Surgery:  Do not apply any deodorants/lotions.  Please wear clean clothes to the hospital/surgery center.   Remember to brush your teeth WITH YOUR REGULAR TOOTHPASTE.   Please read over the following fact sheets that you were given.

## 2018-12-27 NOTE — Progress Notes (Signed)
PCP - N/A Cardiologist - Dixie Dials  Chest x-ray - n/a EKG - 09-11-18 Stress Test - yes, can't remember date Cardiac Cath - 2017  Anesthesia review: yes, heart history  Patient denies shortness of breath, fever, cough and chest pain at PAT appointment   Patient verbalized understanding of instructions that were given to them at the PAT appointment. Patient was also instructed that they will need to review over the PAT instructions again at home before surgery.

## 2018-12-30 NOTE — Anesthesia Preprocedure Evaluation (Addendum)
Anesthesia Evaluation  Patient identified by MRN, date of birth, ID band Patient awake    Reviewed: Allergy & Precautions, NPO status , Patient's Chart, lab work & pertinent test results  Airway Mallampati: III  TM Distance: >3 FB Neck ROM: Full  Mouth opening: Limited Mouth Opening  Dental no notable dental hx. (+) Edentulous Upper, Edentulous Lower, Dental Advisory Given   Pulmonary neg pulmonary ROS, former smoker,    Pulmonary exam normal breath sounds clear to auscultation       Cardiovascular hypertension, negative cardio ROS Normal cardiovascular exam Rhythm:Regular Rate:Normal     Neuro/Psych negative neurological ROS  negative psych ROS   GI/Hepatic negative GI ROS, Neg liver ROS,   Endo/Other  negative endocrine ROS  Renal/GU negative Renal ROS  negative genitourinary   Musculoskeletal  (+) Arthritis ,   Abdominal   Peds  Hematology negative hematology ROS (+)   Anesthesia Other Findings   Reproductive/Obstetrics                           Anesthesia Physical Anesthesia Plan  ASA: II  Anesthesia Plan: Regional and MAC   Post-op Pain Management:  Regional for Post-op pain   Induction: Intravenous  PONV Risk Score and Plan: 1 and Dexamethasone, Ondansetron and Midazolam  Airway Management Planned: Oral ETT  Additional Equipment:   Intra-op Plan:   Post-operative Plan: Extubation in OR  Informed Consent: I have reviewed the patients History and Physical, chart, labs and discussed the procedure including the risks, benefits and alternatives for the proposed anesthesia with the patient or authorized representative who has indicated his/her understanding and acceptance.     Dental advisory given  Plan Discussed with: CRNA  Anesthesia Plan Comments: (Pt had cath in 2017 due to abnormal stress test. Cath showed angiographically normal vessels, he was recommended medical  treatment for non-cardiac chest pain and risk factors modification. Continues to follow with Dr. Doylene Canard. He has a known LBBB per cardiology notes. Cleared for surgery per OV note 09/03/18 stating "in absence of cardiac ischemia symptoms may undergo inguinal hernia surgery when cough is controlled." Pt treated for bronchitis at that time and symptoms resolved.  Treadmill stress test 09/20/16: The patient exercised according to the Bruce protocol, total time recorded 8 minutes 11 seconds.  Achieving max heart rate of 213 which was 138% of MPHR for age and 9.3 METS of work.  Baseline NIBP was 139/72.  Peak NIBP was 140/80.  Max systolic pressure was 497 max diastolic pressure was 96.  During exercise there were no significant ST-T ischemia changes.)       Anesthesia Quick Evaluation

## 2018-12-31 ENCOUNTER — Observation Stay (HOSPITAL_COMMUNITY)
Admission: RE | Admit: 2018-12-31 | Discharge: 2019-01-01 | Disposition: A | Discharge status: Home / Self Care | Payer: Medicare HMO | Attending: Surgery | Admitting: Surgery

## 2018-12-31 ENCOUNTER — Other Ambulatory Visit: Payer: Self-pay

## 2018-12-31 ENCOUNTER — Encounter (HOSPITAL_COMMUNITY): Payer: Self-pay

## 2018-12-31 ENCOUNTER — Encounter (HOSPITAL_COMMUNITY)
Admission: RE | Disposition: A | Discharge status: Home / Self Care | Payer: Self-pay | Source: Home / Self Care | Attending: Surgery

## 2018-12-31 ENCOUNTER — Ambulatory Visit (HOSPITAL_COMMUNITY): Payer: Medicare HMO | Admitting: Physician Assistant

## 2018-12-31 ENCOUNTER — Ambulatory Visit (HOSPITAL_COMMUNITY): Payer: Medicare HMO | Admitting: Anesthesiology

## 2018-12-31 DIAGNOSIS — E669 Obesity, unspecified: Secondary | ICD-10-CM | POA: Insufficient documentation

## 2018-12-31 DIAGNOSIS — Z7982 Long term (current) use of aspirin: Secondary | ICD-10-CM | POA: Diagnosis not present

## 2018-12-31 DIAGNOSIS — M1990 Unspecified osteoarthritis, unspecified site: Secondary | ICD-10-CM | POA: Diagnosis not present

## 2018-12-31 DIAGNOSIS — M545 Low back pain: Secondary | ICD-10-CM | POA: Diagnosis not present

## 2018-12-31 DIAGNOSIS — I441 Atrioventricular block, second degree: Secondary | ICD-10-CM | POA: Insufficient documentation

## 2018-12-31 DIAGNOSIS — K4090 Unilateral inguinal hernia, without obstruction or gangrene, not specified as recurrent: Secondary | ICD-10-CM | POA: Diagnosis not present

## 2018-12-31 DIAGNOSIS — Z6832 Body mass index (BMI) 32.0-32.9, adult: Secondary | ICD-10-CM | POA: Insufficient documentation

## 2018-12-31 DIAGNOSIS — I2510 Atherosclerotic heart disease of native coronary artery without angina pectoris: Secondary | ICD-10-CM | POA: Diagnosis not present

## 2018-12-31 DIAGNOSIS — K4030 Unilateral inguinal hernia, with obstruction, without gangrene, not specified as recurrent: Secondary | ICD-10-CM | POA: Diagnosis not present

## 2018-12-31 DIAGNOSIS — E6609 Other obesity due to excess calories: Secondary | ICD-10-CM | POA: Diagnosis not present

## 2018-12-31 DIAGNOSIS — I10 Essential (primary) hypertension: Secondary | ICD-10-CM | POA: Insufficient documentation

## 2018-12-31 DIAGNOSIS — Z87891 Personal history of nicotine dependence: Secondary | ICD-10-CM | POA: Insufficient documentation

## 2018-12-31 DIAGNOSIS — Z79899 Other long term (current) drug therapy: Secondary | ICD-10-CM | POA: Diagnosis not present

## 2018-12-31 DIAGNOSIS — Z792 Long term (current) use of antibiotics: Secondary | ICD-10-CM | POA: Insufficient documentation

## 2018-12-31 DIAGNOSIS — I119 Hypertensive heart disease without heart failure: Secondary | ICD-10-CM | POA: Diagnosis not present

## 2018-12-31 DIAGNOSIS — G8918 Other acute postprocedural pain: Secondary | ICD-10-CM | POA: Diagnosis not present

## 2018-12-31 HISTORY — PX: INGUINAL HERNIA REPAIR: SUR1180

## 2018-12-31 HISTORY — PX: INSERTION OF MESH: SHX5868

## 2018-12-31 HISTORY — PX: INGUINAL HERNIA REPAIR: SHX194

## 2018-12-31 LAB — CREATININE, SERUM
Creatinine, Ser: 1.12 mg/dL (ref 0.61–1.24)
GFR calc Af Amer: >60 mL/min (ref >60)
GFR calc non Af Amer: >60 mL/min (ref >60)

## 2018-12-31 LAB — CBC
HCT: 42.4 % (ref 39.0–52.0)
Hemoglobin: 14 g/dL (ref 13.0–17.0)
MCH: 30.2 pg (ref 26.0–34.0)
MCHC: 33 g/dL (ref 30.0–36.0)
MCV: 91.6 fL (ref 80.0–100.0)
Platelets: 223 10*3/uL (ref 150–400)
RBC: 4.63 MIL/uL (ref 4.22–5.81)
RDW: 11.8 % (ref 11.5–15.5)
WBC: 7.8 10*3/uL (ref 4.0–10.5)
nRBC: 0 % (ref 0.0–0.2)

## 2018-12-31 SURGERY — REPAIR, HERNIA, INGUINAL, ADULT
Anesthesia: Monitor Anesthesia Care | Site: Groin | Laterality: Right

## 2018-12-31 MED ORDER — ONDANSETRON HCL 4 MG/2ML IJ SOLN
INTRAMUSCULAR | Status: DC | PRN
  Administered 2018-12-31: 4 mg via INTRAVENOUS

## 2018-12-31 MED ORDER — SUCCINYLCHOLINE CHLORIDE 200 MG/10ML IV SOSY
PREFILLED_SYRINGE | INTRAVENOUS | Status: AC
  Filled 2018-12-31: qty 10

## 2018-12-31 MED ORDER — ROCURONIUM BROMIDE 10 MG/ML (PF) SYRINGE
PREFILLED_SYRINGE | INTRAVENOUS | Status: DC | PRN
  Administered 2018-12-31: 10 mg via INTRAVENOUS
  Administered 2018-12-31: 30 mg via INTRAVENOUS

## 2018-12-31 MED ORDER — CEFAZOLIN SODIUM-DEXTROSE 2-4 GM/100ML-% IV SOLN
INTRAVENOUS | Status: AC
  Filled 2018-12-31: qty 100

## 2018-12-31 MED ORDER — FENTANYL CITRATE (PF) 250 MCG/5ML IJ SOLN
INTRAMUSCULAR | Status: AC
  Filled 2018-12-31: qty 5

## 2018-12-31 MED ORDER — LIDOCAINE 2% (20 MG/ML) 5 ML SYRINGE
INTRAMUSCULAR | Status: AC
  Filled 2018-12-31: qty 5

## 2018-12-31 MED ORDER — CLONIDINE HCL (ANALGESIA) 100 MCG/ML EP SOLN
EPIDURAL | Status: DC | PRN
  Administered 2018-12-31: 100 ug

## 2018-12-31 MED ORDER — CHLORHEXIDINE GLUCONATE CLOTH 2 % EX PADS: 6.0000 | MEDICATED_PAD | Freq: Once | CUTANEOUS | Status: DC

## 2018-12-31 MED ORDER — DEXAMETHASONE SODIUM PHOSPHATE 10 MG/ML IJ SOLN
INTRAMUSCULAR | Status: AC
  Filled 2018-12-31: qty 1

## 2018-12-31 MED ORDER — TRAMADOL HCL 50 MG PO TABS: 50.0000 mg | ORAL_TABLET | Freq: Four times a day (QID) | ORAL | Status: DC | PRN

## 2018-12-31 MED ORDER — MORPHINE SULFATE (PF) 2 MG/ML IV SOLN: 2.0000 mg | INTRAVENOUS | Status: DC | PRN

## 2018-12-31 MED ORDER — POLYVINYL ALCOHOL 1.4 % OP SOLN: 1.0000 [drp] | Freq: Three times a day (TID) | OPHTHALMIC | Status: DC | PRN

## 2018-12-31 MED ORDER — DIPHENHYDRAMINE HCL 50 MG/ML IJ SOLN: 12.5000 mg | Freq: Four times a day (QID) | INTRAMUSCULAR | Status: DC | PRN

## 2018-12-31 MED ORDER — LACTATED RINGERS IV SOLN
INTRAVENOUS | Status: DC
  Administered 2018-12-31: 07:00:00 via INTRAVENOUS

## 2018-12-31 MED ORDER — OXYCODONE HCL 5 MG PO TABS: 5.0000 mg | ORAL_TABLET | ORAL | Status: DC | PRN

## 2018-12-31 MED ORDER — AMLODIPINE BESYLATE 2.5 MG PO TABS: 2.5000 mg | ORAL_TABLET | Freq: Every day | ORAL | Status: DC

## 2018-12-31 MED ORDER — GABAPENTIN 300 MG PO CAPS
300.0000 mg | ORAL_CAPSULE | ORAL | Status: AC
  Administered 2018-12-31: 07:00:00 300 mg via ORAL

## 2018-12-31 MED ORDER — PHENYLEPHRINE 40 MCG/ML (10ML) SYRINGE FOR IV PUSH (FOR BLOOD PRESSURE SUPPORT)
PREFILLED_SYRINGE | INTRAVENOUS | Status: DC | PRN
  Administered 2018-12-31: 80 ug via INTRAVENOUS

## 2018-12-31 MED ORDER — ACETAMINOPHEN 500 MG PO TABS
1000.0000 mg | ORAL_TABLET | Freq: Four times a day (QID) | ORAL | Status: DC
  Administered 2018-12-31 – 2019-01-01 (×2): 1000 mg via ORAL
  Filled 2018-12-31 (×2): qty 2

## 2018-12-31 MED ORDER — ACETAMINOPHEN 500 MG PO TABS
ORAL_TABLET | ORAL | Status: AC
  Administered 2018-12-31: 1000 mg via ORAL
  Filled 2018-12-31: qty 2

## 2018-12-31 MED ORDER — SUCCINYLCHOLINE CHLORIDE 200 MG/10ML IV SOSY
PREFILLED_SYRINGE | INTRAVENOUS | Status: DC | PRN
  Administered 2018-12-31: 100 mg via INTRAVENOUS

## 2018-12-31 MED ORDER — ONDANSETRON 4 MG PO TBDP: 4.0000 mg | ORAL_TABLET | Freq: Four times a day (QID) | ORAL | Status: DC | PRN

## 2018-12-31 MED ORDER — SUGAMMADEX SODIUM 200 MG/2ML IV SOLN
INTRAVENOUS | Status: DC | PRN
  Administered 2018-12-31: 200 mg via INTRAVENOUS

## 2018-12-31 MED ORDER — 0.9 % SODIUM CHLORIDE (POUR BTL) OPTIME
TOPICAL | Status: DC | PRN
  Administered 2018-12-31: 1000 mL

## 2018-12-31 MED ORDER — ENOXAPARIN SODIUM 40 MG/0.4ML ~~LOC~~ SOLN
40.0000 mg | SUBCUTANEOUS | Status: DC
  Administered 2019-01-01: 09:00:00 40 mg via SUBCUTANEOUS
  Filled 2018-12-31: qty 0.4

## 2018-12-31 MED ORDER — MIDAZOLAM HCL 2 MG/2ML IJ SOLN
INTRAMUSCULAR | Status: AC
  Administered 2018-12-31: 1 mg via INTRAVENOUS
  Filled 2018-12-31: qty 2

## 2018-12-31 MED ORDER — FENTANYL CITRATE (PF) 100 MCG/2ML IJ SOLN
50.0000 ug | Freq: Once | INTRAMUSCULAR | Status: AC
  Administered 2018-12-31: 09:00:00 50 ug via INTRAVENOUS

## 2018-12-31 MED ORDER — BUPIVACAINE-EPINEPHRINE (PF) 0.5% -1:200000 IJ SOLN
INTRAMUSCULAR | Status: DC | PRN
  Administered 2018-12-31: 30 mL via PERINEURAL

## 2018-12-31 MED ORDER — FENTANYL CITRATE (PF) 100 MCG/2ML IJ SOLN
INTRAMUSCULAR | Status: DC | PRN
  Administered 2018-12-31 (×2): 50 ug via INTRAVENOUS

## 2018-12-31 MED ORDER — CEFAZOLIN SODIUM-DEXTROSE 2-4 GM/100ML-% IV SOLN
2.0000 g | INTRAVENOUS | Status: AC
  Administered 2018-12-31: 2 g via INTRAVENOUS

## 2018-12-31 MED ORDER — ONDANSETRON HCL 4 MG/2ML IJ SOLN
INTRAMUSCULAR | Status: AC
  Filled 2018-12-31: qty 2

## 2018-12-31 MED ORDER — SODIUM CHLORIDE 0.9 % IV SOLN
INTRAVENOUS | Status: DC
  Administered 2018-12-31 – 2019-01-01 (×2): via INTRAVENOUS

## 2018-12-31 MED ORDER — LIDOCAINE 2% (20 MG/ML) 5 ML SYRINGE
INTRAMUSCULAR | Status: DC | PRN
  Administered 2018-12-31: 40 mg via INTRAVENOUS

## 2018-12-31 MED ORDER — HYDRALAZINE HCL 10 MG PO TABS
10.0000 mg | ORAL_TABLET | Freq: Three times a day (TID) | ORAL | Status: DC
  Administered 2018-12-31 – 2019-01-01 (×2): 10 mg via ORAL
  Filled 2018-12-31 (×2): qty 1

## 2018-12-31 MED ORDER — PHENYLEPHRINE 40 MCG/ML (10ML) SYRINGE FOR IV PUSH (FOR BLOOD PRESSURE SUPPORT)
PREFILLED_SYRINGE | INTRAVENOUS | Status: AC
  Filled 2018-12-31: qty 10

## 2018-12-31 MED ORDER — PROPOFOL 10 MG/ML IV BOLUS
INTRAVENOUS | Status: DC | PRN
  Administered 2018-12-31: 110 mg via INTRAVENOUS

## 2018-12-31 MED ORDER — ROCURONIUM BROMIDE 10 MG/ML (PF) SYRINGE
PREFILLED_SYRINGE | INTRAVENOUS | Status: AC
  Filled 2018-12-31: qty 10

## 2018-12-31 MED ORDER — ACETAMINOPHEN 500 MG PO TABS
1000.0000 mg | ORAL_TABLET | ORAL | Status: AC
  Administered 2018-12-31: 07:00:00 1000 mg via ORAL

## 2018-12-31 MED ORDER — GABAPENTIN 300 MG PO CAPS
ORAL_CAPSULE | ORAL | Status: AC
  Administered 2018-12-31: 300 mg via ORAL
  Filled 2018-12-31: qty 1

## 2018-12-31 MED ORDER — OXYCODONE HCL 5 MG PO TABS
5.0000 mg | ORAL_TABLET | ORAL | Status: DC | PRN
  Administered 2018-12-31 – 2019-01-01 (×2): 5 mg via ORAL
  Filled 2018-12-31 (×2): qty 1

## 2018-12-31 MED ORDER — FENTANYL CITRATE (PF) 100 MCG/2ML IJ SOLN
25.0000 ug | INTRAMUSCULAR | Status: DC | PRN
  Administered 2018-12-31: 50 ug via INTRAVENOUS

## 2018-12-31 MED ORDER — PROPOFOL 10 MG/ML IV BOLUS
INTRAVENOUS | Status: AC
  Filled 2018-12-31: qty 20

## 2018-12-31 MED ORDER — ONDANSETRON HCL 4 MG/2ML IJ SOLN: 4.0000 mg | Freq: Four times a day (QID) | INTRAMUSCULAR | Status: DC | PRN

## 2018-12-31 MED ORDER — ATORVASTATIN CALCIUM 10 MG PO TABS
10.0000 mg | ORAL_TABLET | Freq: Every evening | ORAL | Status: DC
  Administered 2018-12-31: 10 mg via ORAL
  Filled 2018-12-31: qty 1

## 2018-12-31 MED ORDER — BUPIVACAINE-EPINEPHRINE 0.25% -1:200000 IJ SOLN
INTRAMUSCULAR | Status: DC | PRN
  Administered 2018-12-31: 10 mL

## 2018-12-31 MED ORDER — DEXAMETHASONE SODIUM PHOSPHATE 10 MG/ML IJ SOLN
INTRAMUSCULAR | Status: DC | PRN
  Administered 2018-12-31: 10 mg via INTRAVENOUS

## 2018-12-31 MED ORDER — FENTANYL CITRATE (PF) 100 MCG/2ML IJ SOLN
INTRAMUSCULAR | Status: AC
  Administered 2018-12-31: 50 ug via INTRAVENOUS
  Filled 2018-12-31: qty 2

## 2018-12-31 MED ORDER — MIDAZOLAM HCL 2 MG/2ML IJ SOLN
1.0000 mg | Freq: Once | INTRAMUSCULAR | Status: AC
  Administered 2018-12-31: 1 mg via INTRAVENOUS

## 2018-12-31 MED ORDER — GABAPENTIN 300 MG PO CAPS
300.0000 mg | ORAL_CAPSULE | Freq: Two times a day (BID) | ORAL | Status: DC
  Administered 2018-12-31: 300 mg via ORAL
  Filled 2018-12-31: qty 1

## 2018-12-31 MED ORDER — BUPIVACAINE-EPINEPHRINE (PF) 0.25% -1:200000 IJ SOLN
INTRAMUSCULAR | Status: AC
  Filled 2018-12-31: qty 30

## 2018-12-31 MED ORDER — DIPHENHYDRAMINE HCL 12.5 MG/5ML PO ELIX
12.5000 mg | ORAL_SOLUTION | Freq: Four times a day (QID) | ORAL | Status: DC | PRN
  Filled 2018-12-31: qty 5

## 2018-12-31 MED ORDER — DOCUSATE SODIUM 100 MG PO CAPS
100.0000 mg | ORAL_CAPSULE | Freq: Two times a day (BID) | ORAL | Status: DC
  Administered 2018-12-31 – 2019-01-01 (×2): 100 mg via ORAL
  Filled 2018-12-31 (×2): qty 1

## 2018-12-31 MED ORDER — FENTANYL CITRATE (PF) 100 MCG/2ML IJ SOLN
INTRAMUSCULAR | Status: AC
  Filled 2018-12-31: qty 2

## 2018-12-31 MED ORDER — ZOLPIDEM TARTRATE 5 MG PO TABS: 5.0000 mg | ORAL_TABLET | Freq: Every evening | ORAL | Status: DC | PRN

## 2018-12-31 SURGICAL SUPPLY — 47 items
APL PRP STRL LF DISP 70% ISPRP (MISCELLANEOUS) ×1
APL SKNCLS STERI-STRIP NONHPOA (GAUZE/BANDAGES/DRESSINGS) ×1
BENZOIN TINCTURE PRP APPL 2/3 (GAUZE/BANDAGES/DRESSINGS) ×3 IMPLANT
BLADE CLIPPER SURG (BLADE) IMPLANT
CHLORAPREP W/TINT 26 (MISCELLANEOUS) ×3 IMPLANT
CLOSURE STERI-STRIP 1/4X4 (GAUZE/BANDAGES/DRESSINGS) ×1 IMPLANT
COVER SURGICAL LIGHT HANDLE (MISCELLANEOUS) ×3 IMPLANT
COVER WAND RF STERILE (DRAPES) ×3 IMPLANT
DRAIN PENROSE 1/2X12 LTX STRL (WOUND CARE) IMPLANT
DRAPE LAPAROSCOPIC ABDOMINAL (DRAPES) IMPLANT
DRAPE LAPAROTOMY TRNSV 102X78 (DRAPES) IMPLANT
DRSG TEGADERM 2-3/8X2-3/4 SM (GAUZE/BANDAGES/DRESSINGS) ×2 IMPLANT
DRSG TEGADERM 4X4.75 (GAUZE/BANDAGES/DRESSINGS) ×3 IMPLANT
ELECT CAUTERY BLADE 6.4 (BLADE) IMPLANT
ELECT REM PT RETURN 9FT ADLT (ELECTROSURGICAL) ×2
ELECTRODE REM PT RTRN 9FT ADLT (ELECTROSURGICAL) ×2 IMPLANT
GAUZE 4X4 16PLY RFD (DISPOSABLE) ×3 IMPLANT
GAUZE SPONGE 4X4 12PLY STRL (GAUZE/BANDAGES/DRESSINGS) ×3 IMPLANT
GLOVE BIO SURGEON STRL SZ7 (GLOVE) ×3 IMPLANT
GLOVE BIOGEL PI IND STRL 7.5 (GLOVE) ×2 IMPLANT
GLOVE BIOGEL PI INDICATOR 7.5 (GLOVE) ×1
GOWN STRL REUS W/ TWL LRG LVL3 (GOWN DISPOSABLE) ×4 IMPLANT
GOWN STRL REUS W/TWL LRG LVL3 (GOWN DISPOSABLE) ×4
KIT BASIN OR (CUSTOM PROCEDURE TRAY) ×3 IMPLANT
KIT TURNOVER KIT B (KITS) ×3 IMPLANT
MESH PARIETEX PROGRIP RIGHT (Mesh General) ×1 IMPLANT
NDL HYPO 25GX1X1/2 BEV (NEEDLE) ×2 IMPLANT
NEEDLE HYPO 25GX1X1/2 BEV (NEEDLE) ×2 IMPLANT
NS IRRIG 1000ML POUR BTL (IV SOLUTION) ×3 IMPLANT
PACK GENERAL/GYN (CUSTOM PROCEDURE TRAY) ×3 IMPLANT
PAD ARMBOARD 7.5X6 YLW CONV (MISCELLANEOUS) ×3 IMPLANT
PENCIL SMOKE EVACUATOR (MISCELLANEOUS) ×3 IMPLANT
SPONGE INTESTINAL PEANUT (DISPOSABLE) ×3 IMPLANT
STRIP CLOSURE SKIN 1/2X4 (GAUZE/BANDAGES/DRESSINGS) ×3 IMPLANT
SUT MNCRL AB 4-0 PS2 18 (SUTURE) ×3 IMPLANT
SUT PDS AB 0 CT 36 (SUTURE) ×1 IMPLANT
SUT SILK 2 0 SH (SUTURE) IMPLANT
SUT SILK 3 0 (SUTURE)
SUT SILK 3-0 18XBRD TIE 12 (SUTURE) IMPLANT
SUT VIC AB 0 CT2 27 (SUTURE) ×3 IMPLANT
SUT VIC AB 2-0 SH 27 (SUTURE) ×2
SUT VIC AB 2-0 SH 27X BRD (SUTURE) ×2 IMPLANT
SUT VIC AB 3-0 SH 27 (SUTURE) ×2
SUT VIC AB 3-0 SH 27XBRD (SUTURE) ×2 IMPLANT
SYR CONTROL 10ML LL (SYRINGE) ×3 IMPLANT
TOWEL GREEN STERILE FF (TOWEL DISPOSABLE) ×3 IMPLANT
TOWEL OR 17X26 10 PK STRL BLUE (TOWEL DISPOSABLE) ×3 IMPLANT

## 2018-12-31 NOTE — Progress Notes (Signed)
Per CCMD, pt had a 2nd AV block type II at 1556. Monitor EKG printed and placed in pt chart.  12 lead EKG done and placed in pt's chart.  Pt's vitals are as follow:   12/31/18 1631  Vitals  Temp 97.9 F (36.6 C)  Temp Source Oral  BP 109/63  MAP (mmHg) 77  BP Location Left Arm  BP Method Automatic  Patient Position (if appropriate) Lying  Pulse Rate 62  Resp 18  Oxygen Therapy  SpO2 100 %  O2 Device Room Air     12/31/18 1735  Vitals  Temp 97.7 F (36.5 C)  Temp Source Oral  BP 126/67  MAP (mmHg) 82  BP Method Automatic  Pulse Rate 63  Pulse Rate Source Monitor  Oxygen Therapy  SpO2 99 %   Pt states having pain 4 out of 10 in surgical area.  Pt states that is not having any chest pain or shortness of breath.  Pt is currently watching television and eating in his bed at room air. Call bell within reach.  Edmonia James MD to make aware of EKG rhythms.

## 2018-12-31 NOTE — Anesthesia Procedure Notes (Signed)
Procedure Name: Intubation Date/Time: 12/31/2018 9:04 AM Performed by: Jenne Campus, CRNA Pre-anesthesia Checklist: Patient identified, Emergency Drugs available, Suction available and Patient being monitored Patient Re-evaluated:Patient Re-evaluated prior to induction Oxygen Delivery Method: Circle System Utilized Preoxygenation: Pre-oxygenation with 100% oxygen Induction Type: IV induction Tube type: Oral Tube size: 7.5 mm Number of attempts: 1 Airway Equipment and Method: Stylet and Oral airway Placement Confirmation: ETT inserted through vocal cords under direct vision,  positive ETCO2 and breath sounds checked- equal and bilateral Secured at: 23 cm Tube secured with: Tape Dental Injury: Teeth and Oropharynx as per pre-operative assessment  Comments: RSI

## 2018-12-31 NOTE — Discharge Instructions (Signed)
[facility] ° ° INGUINAL HERNIA REPAIR: POST OP INSTRUCTIONS ° °Always review your discharge instruction sheet given to you by the facility where your surgery was performed. °IF YOU HAVE DISABILITY OR FAMILY LEAVE FORMS, YOU MUST BRING THEM TO THE OFFICE FOR PROCESSING.   °DO NOT GIVE THEM TO YOUR DOCTOR. ° °1. A  prescription for pain medication may be given to you upon discharge.  Take your pain medication as prescribed, if needed.  If narcotic pain medicine is not needed, then you may take acetaminophen (Tylenol) or ibuprofen (Advil) as needed. °2. Take your usually prescribed medications unless otherwise directed. °3. If you need a refill on your pain medication, please contact your pharmacy.  They will contact our office to request authorization. Prescriptions will not be filled after 5 pm or on week-ends. °4. You should follow a light diet the first 24 hours after arrival home, such as soup and crackers, etc.  Be sure to include lots of fluids daily.  Resume your normal diet the day after surgery. °5. Most patients will experience some swelling and bruising around the umbilicus or in the groin and scrotum.  Ice packs and reclining will help.  Swelling and bruising can take several days to resolve.  °6. It is common to experience some constipation if taking pain medication after surgery.  Increasing fluid intake and taking a stool softener (such as Colace) will usually help or prevent this problem from occurring.  A mild laxative (Milk of Magnesia or Miralax) should be taken according to package directions if there are no bowel movements after 48 hours. °7. Unless discharge instructions indicate otherwise, you may remove your bandages 24-48 hours after surgery, and you may shower at that time.  You will have steri-strips (small skin tapes) in place directly over the incision.  These strips should be left on the skin for 7-10 days. °8. ACTIVITIES:  You may resume regular (light) daily activities  beginning the next day--such as daily self-care, walking, climbing stairs--gradually increasing activities as tolerated.  You may have sexual intercourse when it is comfortable.  Refrain from any heavy lifting or straining until approved by your doctor. °a. You may drive when you are no longer taking prescription pain medication, you can comfortably wear a seatbelt, and you can safely maneuver your car and apply brakes. °b. RETURN TO WORK:  2-3 weeks with light duty - no lifting over 15 lbs. °9. You should see your doctor in the office for a follow-up appointment approximately 2-3 weeks after your surgery.  Make sure that you call for this appointment within a day or two after you arrive home to insure a convenient appointment time. °10. OTHER INSTRUCTIONS:  __________________________________________________________________________________________________________________________________________________________________________________________  °WHEN TO CALL YOUR DOCTOR: °1. Fever over 101.0 °2. Inability to urinate °3. Nausea and/or vomiting °4. Extreme swelling or bruising °5. Continued bleeding from incision. °6. Increased pain, redness, or drainage from the incision ° °The clinic staff is available to answer your questions during regular business hours.  Please don’t hesitate to call and ask to speak to one of the nurses for clinical concerns.  If you have a medical emergency, go to the nearest emergency room or call 911.  A surgeon from [facility] is always on call at the hospital ° ° °[address], [address], [place], [place]  [address] ? ° [address] [address], [place], [place]   [address] °[phone]    [phone]    FAX [phone] °Web site:   [email or web address] ° ° °

## 2018-12-31 NOTE — Anesthesia Procedure Notes (Signed)
Anesthesia Regional Block: Quadratus lumborum   Pre-Anesthetic Checklist: ,, timeout performed, Correct Patient, Correct Site, Correct Laterality, Correct Procedure, Correct Position, site marked, Risks and benefits discussed,  Surgical consent,  Pre-op evaluation,  At surgeon's request and post-op pain management  Laterality: Right  Prep: Maximum Sterile Barrier Precautions used, chloraprep       Needles:  Injection technique: Single-shot  Needle Type: Echogenic Stimulator Needle     Needle Length: 9cm  Needle Gauge: 22     Additional Needles:   Procedures:,,,, ultrasound used (permanent image in chart),,,,  Narrative:  Start time: 12/31/2018 8:20 AM End time: 12/31/2018 8:30 AM Injection made incrementally with aspirations every 5 mL.  Performed by: Personally  Anesthesiologist: Freddrick March, MD  Additional Notes: Monitors applied. No increased pain on injection. No increased resistance to injection. Injection made in 5cc increments. Good needle visualization. Patient tolerated procedure well.

## 2018-12-31 NOTE — H&P (Signed)
History of Present Illness  The patient is a 68 year old male who presents with an inguinal hernia. Referred by Dr. Jola Schmidt for large right inguinal hernia PCP - Doylene Canard  This is a 68 year old male with history of coronary artery disease who presents with several years of an enlarging right inguinal hernia. The patient chose not to have this area examined and treated because of a difficult family situation. The hernia has become much larger and is very uncomfortable. He presented to the emergency department for evaluation. There were able partially reduced a hernia and they have referred him to our office for further evaluation. The patient reports some difficulty with bowel movements. The patient lives at home with just his granddaughter. He has limited assistance.   Allergies  No Known Drug Allergies  Allergies Reconciled  Medication History  Doxycycline Hyclate (100MG  Capsule, Oral) Active. amLODIPine Besy-Benazepril HCl (2.5-10MG  Capsule, Oral) Active. Aspirin (81MG  Tablet, Oral) Active. Benzonatate (100MG  Capsule, Oral) Active. Medications Reconciled  Other Problems  No pertinent past medical history     Review of Systems  Gastrointestinal Not Present- Abdominal Pain, Bloating, Bloody Stool, Change in Bowel Habits, Chronic diarrhea, Constipation, Difficulty Swallowing, Excessive gas, Gets full quickly at meals, Hemorrhoids, Indigestion, Nausea, Rectal Pain and Vomiting.  Vitals  Weight: 213 lb Height: 69in Body Surface Area: 2.12 m Body Mass Index: 31.45 kg/m  Temp.: 97.66F  Pulse: 64 (Regular)  BP: 150/74 (Sitting, Left Arm, Standard)      Physical Exam   The physical exam findings are as follows: Note:WDWN in NAD Eyes: Pupils equal, round; sclera anicteric HENT: Oral mucosa moist; good dentition Neck: No masses palpated, no thyromegaly Lungs: CTA bilaterally; normal respiratory effort CV: Regular rate and rhythm;  no murmurs; extremities well-perfused with no edema Abd: +bowel sounds, soft, non-tender, no palpable organomegaly; no palpable hernias Large right groin bulge visible through the patient's clothes Bilateral descended testes; no testicular masses; massive right inguinal hernia extending down into the scrotum. Partially reducible; no sign of left inguinal hernia Skin: Warm, dry; no sign of jaundice Psychiatric - alert and oriented x 4; calm mood and affect    Assessment & Plan   INCARCERATED RIGHT INGUINAL HERNIA (K40.30)  Current Plans Schedule for Surgery - Right inguinal hernia repair with mesh. The surgical procedure has been discussed with the patient. Potential risks, benefits, alternative treatments, and expected outcomes have been explained. All of the patient's questions at this time have been answered. The likelihood of reaching the patient's treatment goal is good. The patient understand the proposed surgical procedure and wishes to proceed.   Austin Burn. Georgette Dover, MD, [facility] Beeper [phone]  12/31/2018 8:24 AM

## 2018-12-31 NOTE — Op Note (Signed)
Open repair of incarcerated right inguinal hernia with mesh Procedure Note  Indications: This is a 68 year old male with history of coronary artery disease who presents with several years of an enlarging right inguinal hernia. The patient chose not to have this area examined and treated because of a difficult family situation. The hernia has become much larger and is very uncomfortable. He presented to the emergency department for evaluation. There were able partially reduced a hernia and they have referred him to our office for further evaluation. The patient reports some difficulty with bowel movements.   Pre-operative Diagnosis: right incarcerated inguinal hernia Post-operative Diagnosis: same  Surgeon: Wynona LunaMatthew K Juanette Urizar   Assistants: Jenell MillinerMegan Dove, RNFA  Anesthesia: General endotracheal anesthesia and TAP Block  ASA Class: 3  Procedure Details  The patient was seen again in the Holding Room. The risks, benefits, complications, treatment options, and expected outcomes were discussed with the patient. The possibilities of reaction to medication, pulmonary aspiration, perforation of viscus, bleeding, recurrent infection, the need for additional procedures, and development of a complication requiring transfusion or further operation were discussed with the patient and/or family. The likelihood of success in repairing the hernia and returning the patient to their previous functional status is good.  There was concurrence with the proposed plan, and informed consent was obtained. The site of surgery was properly noted/marked. The patient was taken to the Operating Room, identified as Miguel DibbleElton L Ackley, and the procedure verified as right inguinal hernia repair. A Time Out was held and the above information confirmed.  The patient was placed in the supine position and underwent induction of anesthesia. The lower abdomen and groin was prepped with Chloraprep and the scrotum was prepped with Betadine.  We  draped in the standard fashion, and 0.25% Marcaine with epinephrine was used to anesthetize the skin over the mid-portion of the inguinal canal. A long oblique incision was made. Dissection was carried down through the subcutaneous tissue with cautery to the external oblique fascia.  We encountered a very large hernia sac extending down into the scrotum. We opened the external oblique fascia along the direction of its fibers to the external ring.  The spermatic cord was circumferentially dissected bluntly and retracted with a Penrose drain.  I delivered the hernia sac and testicle out of the right hemiscrotum.  We dissected the hernia sac free from the testicle and spermatic cord.  The hernia was opened and contained some bowel.  This was reduced back into the peritoneal cavity.  We ligated the hernia sac with 2-0 silk and amputated most of the hernia sac.  The stump of the hernia sac was reduced.  The floor of the inguinal canal was inspected and revealed a very large direct defect.  The floor of the inguinal canal was closed with 0 PDS.  We used a right Progrip mesh which was inserted and deployed across the floor of the inguinal canal. The mesh was tucked underneath the external oblique fascia laterally.  The flap of the mesh was closed around the spermatic cord to recreate the internal inguinal ring.  The mesh was secured to the pubic tubercle with 0 Vicryl.  Multiple additional stays sutures were used around the edge of the mesh to secure it in place to the shelving edge and the internal oblique fascia.  The external oblique fascia was reapproximated with 2-0 Vicryl.  3-0 Vicryl was used to close the subcutaneous tissues and 4-0 Monocryl was used to close the skin in subcuticular fashion.  Benzoin and steri-strips were used to seal the incision.  A clean dressing was applied.  The patient was then extubated and brought to the recovery room in stable condition.  All sponge, instrument, and needle counts were  correct prior to closure and at the conclusion of the case.   Estimated Blood Loss: less than 50 mL                 Complications: None; patient tolerated the procedure well.         Disposition: PACU - hemodynamically stable.         Condition: stable  Imogene Burn. Georgette Dover, MD, [facility] [phone]  12/31/2018 10:52 AM

## 2018-12-31 NOTE — Transfer of Care (Signed)
Immediate Anesthesia Transfer of Care Note  Patient: Austin Harrington  Procedure(s) Performed: RIGHT HERNIA REPAIR INGUINAL ADULT (Right Groin) INSERTION OF MESH (Right Groin)  Patient Location: PACU  Anesthesia Type:General  Level of Consciousness: awake, oriented and patient cooperative  Airway & Oxygen Therapy: Patient Spontanous Breathing and Patient connected to face mask oxygen  Post-op Assessment: Report given to RN and Post -op Vital signs reviewed and stable  Post vital signs: Reviewed  Last Vitals:  Vitals Value Taken Time  BP 130/64 12/31/18 1100  Temp    Pulse 69 12/31/18 1102  Resp 15 12/31/18 1102  SpO2 96 % 12/31/18 1102  Vitals shown include unvalidated device data.  Last Pain:  Vitals:   12/31/18 0840  PainSc: 0-No pain         Complications: No apparent anesthesia complications

## 2018-12-31 NOTE — Progress Notes (Signed)
Spoke with pt's sister Chipper Oman) with pt's permission, also requested by patient to add her to contact information. Spoke with pt's sister regarding plan of care. Questions and concerns were answered.

## 2018-12-31 NOTE — Consult Note (Signed)
Referring Physician: Suzanna Obeysuei, Mathew, MD  Austin Harrington is an 68 y.o. male.                       Chief Complaint: Hypertension in patient with right inguinal hernia  HPI: 68 year old male underwent large right inguinal hernia repair. He has PMH of hypertension and arthritis. His vital signs are stable post procedure but he is sleepy. He denies chest pain.  Past Medical History:  Diagnosis Date  . Arthritis   . History of vertebral fracture   . Hypertension       Past Surgical History:  Procedure Laterality Date  . BACK SURGERY    . CARDIAC CATHETERIZATION N/A 08/19/2015   Procedure: Left Heart Cath and Coronary Angiography;  Surgeon: Orpah CobbAjay Joniah Bednarski, MD;  Location: [facility];  Service: Cardiovascular;  Laterality: N/A;    History reviewed. No pertinent family history. Social History:  reports that he quit smoking about 35 years ago. He has quit using smokeless tobacco. He reports that he does not drink alcohol or use drugs.  Allergies: No Known Allergies  Medications Prior to Admission  Medication Sig Dispense Refill  . amLODipine (NORVASC) 2.5 MG tablet Take 2.5 mg by mouth daily.  6  . aspirin 81 MG tablet Take 81 mg by mouth daily.     Marland Kitchen. atorvastatin (LIPITOR) 10 MG tablet Take 10 mg by mouth every evening.  1  . hydroxypropyl methylcellulose / hypromellose (ISOPTO TEARS / GONIOVISC) 2.5 % ophthalmic solution Place 1 drop into both eyes 3 (three) times daily as needed for dry eyes.      Results for orders placed or performed during the hospital encounter of 12/31/18 (from the past 48 hour(s))  CBC     Status: None   Collection Time: 12/31/18 12:28 PM  Result Value Ref Range   WBC 7.8 4.0 - 10.5 K/uL   RBC 4.63 4.22 - 5.81 MIL/uL   Hemoglobin 14.0 13.0 - 17.0 g/dL   HCT 16.142.4 09.639.0 - 04.552.0 %   MCV 91.6 80.0 - 100.0 fL   MCH 30.2 26.0 - 34.0 pg   MCHC 33.0 30.0 - 36.0 g/dL   RDW 40.911.8 81.111.5 - 91.415.5 %   Platelets 223 150 - 400 K/uL   nRBC 0.0 0.0 - 0.2 %    Comment:  Performed at [facility], [address] [place], [place] [address]  Creatinine, serum     Status: None   Collection Time: 12/31/18 12:28 PM  Result Value Ref Range   Creatinine, Ser 1.12 0.61 - 1.24 mg/dL   GFR calc non Af Amer >60 >60 mL/min   GFR calc Af Amer >60 >60 mL/min    Comment: Performed at [facility], [address] [place], [place] [address]   No results found.  Review Of Systems Constitutional: No fever, chills, weight loss or gain. Eyes: No vision change, wears glasses. No discharge or pain. Ears: No hearing loss, No tinnitus. Respiratory: No asthma, COPD, pneumonias. No shortness of breath. No hemoptysis. Cardiovascular: No chest pain, palpitation, leg edema. Gastrointestinal: No nausea, vomiting, diarrhea, constipation. No GI bleed. No hepatitis. Genitourinary: No dysuria, hematuria, kidney stone. No incontinance. Neurological: No headache, stroke, seizures.  Psychiatry: No psych facility admission for anxiety, depression, suicide. No detox. Skin: No rash. Musculoskeletal: Positive joint pain, no fibromyalgia. No neck pain, positive back pain. Lymphadenopathy: No lymphadenopathy. Hematology: No anemia or easy bruising.   Blood pressure  124/68, pulse 60, temperature 97.8 F (36.6 C), temperature source Oral, resp. rate 18, height 5\' 9"  (1.753 m), weight 97.2 kg, SpO2 97 %. Body mass index is 31.64 kg/m. General appearance: alert, cooperative, appears stated age and no distress Head: Normocephalic, atraumatic. Eyes: Brown eyes, pink conjunctiva, corneas clear. PERRL, EOM's intact. Neck: No adenopathy, no carotid bruit, no JVD, supple, symmetrical, trachea midline and thyroid not enlarged. Resp: Clear to auscultation bilaterally. Cardio: Regular rate and rhythm, S1, S2 normal, II/VI systolic murmur, no click, rub or gallop GI: Soft, non-tender; bowel sounds normal; no organomegaly. Right inguinal area dressing seen. Extremities: No edema, cyanosis  or clubbing. Skin: Warm and dry.  Neurologic: Alert and oriented X 3, normal strength. Normal coordination.  Assessment/Plan Right incarcerated inguinal hernia Hypertension Arthritis Obesity  Time spent: Review of old records, Lab, x-rays, EKG, other cardiac tests, examination, discussion with patient over 70 minutes.  Birdie Riddle, MD  12/31/2018, 2:08 PM

## 2018-12-31 NOTE — Anesthesia Postprocedure Evaluation (Signed)
Anesthesia Post Note  Patient: Austin Harrington  Procedure(s) Performed: RIGHT HERNIA REPAIR INGUINAL ADULT (Right Groin) INSERTION OF MESH (Right Groin)     Patient location during evaluation: PACU Anesthesia Type: Regional and General Level of consciousness: awake and alert Pain management: pain level controlled Vital Signs Assessment: post-procedure vital signs reviewed and stable Respiratory status: spontaneous breathing, nonlabored ventilation, respiratory function stable and patient connected to nasal cannula oxygen Cardiovascular status: blood pressure returned to baseline and stable Postop Assessment: no apparent nausea or vomiting Anesthetic complications: no    Last Vitals:  Vitals:   12/31/18 1201 12/31/18 1222  BP: 121/72 124/68  Pulse: 64 60  Resp: 11 18  Temp: (!) 36.2 C 36.6 C  SpO2: 98% 97%    Last Pain:  Vitals:   12/31/18 1222  TempSrc: Oral  PainSc:                  [name]

## 2019-01-01 ENCOUNTER — Encounter (HOSPITAL_COMMUNITY): Payer: Self-pay | Admitting: Surgery

## 2019-01-01 DIAGNOSIS — K4030 Unilateral inguinal hernia, with obstruction, without gangrene, not specified as recurrent: Secondary | ICD-10-CM | POA: Diagnosis not present

## 2019-01-01 DIAGNOSIS — E6609 Other obesity due to excess calories: Secondary | ICD-10-CM | POA: Diagnosis not present

## 2019-01-01 DIAGNOSIS — K4090 Unilateral inguinal hernia, without obstruction or gangrene, not specified as recurrent: Secondary | ICD-10-CM | POA: Diagnosis not present

## 2019-01-01 DIAGNOSIS — M545 Low back pain: Secondary | ICD-10-CM | POA: Diagnosis not present

## 2019-01-01 DIAGNOSIS — I119 Hypertensive heart disease without heart failure: Secondary | ICD-10-CM | POA: Diagnosis not present

## 2019-01-01 MED ORDER — OXYCODONE HCL 5 MG PO TABS: 5.0000 mg | ORAL_TABLET | Freq: Four times a day (QID) | ORAL | 0 refills | Status: DC | PRN

## 2019-01-01 NOTE — Progress Notes (Signed)
Writer Called [place] office talked with [facility] receptionist regarding him calling in RX for his pain meds after surgery to [facility] on [address] [place],Kathy reported would have triage nurse call RX in for patient. Patient has left facility already.

## 2019-01-01 NOTE — Consult Note (Signed)
Ref: Dixie Dials, MD   Subjective:  Heart rate and blood pressure improved. Alert. VS stable.  Objective:  Vital Signs in the last 24 hours: Temp:  [97 F (36.1 C)-98.8 F (37.1 C)] 97.4 F (36.3 C) (07/01 0915) Pulse Rate:  [60-76] 62 (07/01 0915) Cardiac Rhythm: Heart block (06/30 2325) Resp:  [11-18] 15 (07/01 0915) BP: (109-130)/(63-72) 127/64 (07/01 0915) SpO2:  [96 %-100 %] 100 % (07/01 0915) Weight:  [97.1 kg-97.7 kg] 97.1 kg (07/01 0600)  Physical Exam: BP Readings from Last 1 Encounters:  01/01/19 127/64     Wt Readings from Last 1 Encounters:  01/01/19 97.1 kg    Weight change:  Body mass index is 31.62 kg/m. HEENT: [place]/AT, Eyes-Brown, PERL, EOMI, Conjunctiva-Pink, Sclera-Non-icteric Neck: No JVD, No bruit, Trachea midline. Lungs:  Clear, Bilateral. Cardiac:  Regular rhythm, normal S1 and S2, no S3. II/VI systolic murmur. Abdomen:  Soft, non-tender. BS present. Extremities:  No edema present. No cyanosis. No clubbing. Right inguinal area dressing. CNS: AxOx3, Cranial nerves grossly intact, moves all 4 extremities.  Skin: Warm and dry.   Intake/Output from previous day: 06/30 0701 - 07/01 0700 In: 2349.8 [P.O.:600; I.V.:1749.8] Out: 2745 [Urine:2725; Blood:20]    Lab Results: BMET    Component Value Date/Time   NA 137 12/27/2018 1430   NA 138 09/16/2018 1033   NA 139 01/14/2016 1659   K 4.1 12/27/2018 1430   K 4.1 09/16/2018 1033   K 4.5 01/14/2016 1659   CL 104 12/27/2018 1430   CL 107 09/16/2018 1033   CL 104 01/14/2016 1659   CO2 24 12/27/2018 1430   CO2 23 09/16/2018 1033   CO2 28 01/14/2016 1659   GLUCOSE 97 12/27/2018 1430   GLUCOSE 90 09/16/2018 1033   GLUCOSE 94 01/14/2016 1659   BUN 13 12/27/2018 1430   BUN 11 09/16/2018 1033   BUN 13 01/14/2016 1659   CREATININE 1.12 12/31/2018 1228   CREATININE 0.93 12/27/2018 1430   CREATININE 1.01 09/16/2018 1033   CREATININE 1.19 01/14/2016 1659   CALCIUM 9.4 12/27/2018 1430   CALCIUM 9.5  09/16/2018 1033   CALCIUM 9.6 01/14/2016 1659   GFRNONAA >60 12/31/2018 1228   GFRNONAA >60 12/27/2018 1430   GFRNONAA >60 09/16/2018 1033   GFRAA >60 12/31/2018 1228   GFRAA >60 12/27/2018 1430   GFRAA >60 09/16/2018 1033   CBC    Component Value Date/Time   WBC 7.8 12/31/2018 1228   RBC 4.63 12/31/2018 1228   HGB 14.0 12/31/2018 1228   HCT 42.4 12/31/2018 1228   PLT 223 12/31/2018 1228   MCV 91.6 12/31/2018 1228   MCH 30.2 12/31/2018 1228   MCHC 33.0 12/31/2018 1228   RDW 11.8 12/31/2018 1228   HEPATIC Function Panel No results for input(s): PROT in the last 8760 hours.  Invalid input(s):  ALBUMIN,  AST,  ALT,  ALKPHOS,  BILIDIR,  IBILI HEMOGLOBIN A1C No components found for: HGA1C,  MPG CARDIAC ENZYMES No results found for: CKTOTAL, CKMB, CKMBINDEX, TROPONINI BNP No results for input(s): PROBNP in the last 8760 hours. TSH No results for input(s): TSH in the last 8760 hours. CHOLESTEROL No results for input(s): CHOL in the last 8760 hours.  Scheduled Meds: . acetaminophen  1,000 mg Oral Q6H  . atorvastatin  10 mg Oral QPM  . docusate sodium  100 mg Oral BID  . enoxaparin (LOVENOX) injection  40 mg Subcutaneous Q24H  . hydrALAZINE  10 mg Oral Q8H   Continuous Infusions: .  sodium chloride 75 mL/hr at 01/01/19 0424   PRN Meds:.diphenhydrAMINE **OR** diphenhydrAMINE, ondansetron **OR** ondansetron (ZOFRAN) IV, oxyCODONE, polyvinyl alcohol  Assessment/Plan:  Right incarcerated inguinal hernia HTN Arthritis Obesity 2nd degree AV block, type 1, transient  Continue medical treatment. F/U in 1 week.   LOS: 0 days   Time spent including chart review, lab review, examination, discussion with patient : 25 min   Orpah CobbAjay Zyquan Crotty  MD  01/01/2019, 9:18 AM

## 2019-01-01 NOTE — Plan of Care (Signed)
  Problem: Education: Goal: Knowledge of General Education information will improve Description: Including pain rating scale, medication(s)/side effects and non-pharmacologic comfort measures Outcome: Adequate for Discharge   

## 2019-01-01 NOTE — Discharge Summary (Signed)
Physician Discharge Summary  Patient ID: Austin Harrington MRN: [id number] DOB/AGE: [birth year] 68 y.o.  Admit date: 12/31/2018 Discharge date: 01/01/2019  Admission Diagnoses:Incarcerated right inguinal hernia   Coronary artery disease  Discharge Diagnoses: same Active Problems:   Incarcerated right inguinal hernia   Discharged Condition: good  Hospital Course: Open repair of a large incarcerated right inguinal hernia with mesh on 01/01/19.  Patient stayed overnight on a cardiac monitor due to his cardiac history.  He is feeling well with pain well-controlled.  He has been able to void.    Consults: cardiology - Dr. Doylene Canard  Treatments: surgery: as above  Discharge Exam: Blood pressure 120/63, pulse 64, temperature 98.4 F (36.9 C), temperature source Oral, resp. rate 18, height 5\' 9"  (1.753 m), weight 97.1 kg, SpO2 99 %. General appearance: alert, cooperative and no distress GI: soft, non-tender; bowel sounds normal; no masses,  no organomegaly Right inguinal incision - dressing dry; minimal swelling; scrotum without hematoma or seroma  Disposition: Discharge disposition: 01-Home or Self Care       Discharge Instructions    Call MD for:  persistant nausea and vomiting   Complete by: As directed    Call MD for:  redness, tenderness, or signs of infection (pain, swelling, redness, odor or green/yellow discharge around incision site)   Complete by: As directed    Call MD for:  severe uncontrolled pain   Complete by: As directed    Call MD for:  temperature >100.4   Complete by: As directed    Diet general   Complete by: As directed    Driving Restrictions   Complete by: As directed    Do not drive while taking pain medications   Increase activity slowly   Complete by: As directed    May shower / Bathe   Complete by: As directed      Allergies as of 01/01/2019   No Known Allergies     Medication List    TAKE these medications   amLODipine 2.5 MG tablet Commonly known  as: NORVASC Take 2.5 mg by mouth daily.   aspirin 81 MG tablet Take 81 mg by mouth daily.   atorvastatin 10 MG tablet Commonly known as: LIPITOR Take 10 mg by mouth every evening.   hydroxypropyl methylcellulose / hypromellose 2.5 % ophthalmic solution Commonly known as: ISOPTO TEARS / GONIOVISC Place 1 drop into both eyes 3 (three) times daily as needed for dry eyes.   oxyCODONE 5 MG immediate release tablet Commonly known as: Oxy IR/ROXICODONE Take 1 tablet (5 mg total) by mouth every 6 (six) hours as needed for severe pain.      Follow-up Information    Donnie Mesa, MD. Schedule an appointment as soon as possible for a visit in 3 weeks.   Specialty: General Surgery Contact information: [address] [place] [address] [phone]           Signed: Maia Petties 01/01/2019, 6:52 AM

## 2019-01-09 DIAGNOSIS — I10 Essential (primary) hypertension: Secondary | ICD-10-CM | POA: Diagnosis not present

## 2019-01-09 DIAGNOSIS — R0602 Shortness of breath: Secondary | ICD-10-CM | POA: Diagnosis not present

## 2019-01-09 DIAGNOSIS — M545 Low back pain: Secondary | ICD-10-CM | POA: Diagnosis not present

## 2019-04-16 DIAGNOSIS — I10 Essential (primary) hypertension: Secondary | ICD-10-CM | POA: Diagnosis not present

## 2019-04-16 DIAGNOSIS — R0602 Shortness of breath: Secondary | ICD-10-CM | POA: Diagnosis not present

## 2019-04-16 DIAGNOSIS — R102 Pelvic and perineal pain: Secondary | ICD-10-CM | POA: Diagnosis not present

## 2019-08-18 ENCOUNTER — Other Ambulatory Visit: Payer: Self-pay | Admitting: Cardiovascular Disease

## 2019-08-18 ENCOUNTER — Ambulatory Visit
Admission: RE | Admit: 2019-08-18 | Discharge: 2019-08-18 | Disposition: A | Discharge status: Home / Self Care | Payer: Medicare HMO | Source: Ambulatory Visit | Attending: Cardiovascular Disease | Admitting: Cardiovascular Disease

## 2019-08-18 DIAGNOSIS — R079 Chest pain, unspecified: Secondary | ICD-10-CM

## 2019-08-18 DIAGNOSIS — R109 Unspecified abdominal pain: Secondary | ICD-10-CM

## 2019-12-30 ENCOUNTER — Emergency Department (HOSPITAL_COMMUNITY): Payer: Medicare HMO

## 2019-12-30 ENCOUNTER — Other Ambulatory Visit: Payer: Self-pay

## 2019-12-30 ENCOUNTER — Encounter (HOSPITAL_COMMUNITY): Payer: Self-pay

## 2019-12-30 ENCOUNTER — Emergency Department (HOSPITAL_COMMUNITY)
Admission: EM | Admit: 2019-12-30 | Discharge: 2019-12-30 | Disposition: A | Discharge status: Home / Self Care | Payer: Medicare HMO | Attending: Emergency Medicine | Admitting: Emergency Medicine

## 2019-12-30 DIAGNOSIS — Z7982 Long term (current) use of aspirin: Secondary | ICD-10-CM | POA: Insufficient documentation

## 2019-12-30 DIAGNOSIS — Y929 Unspecified place or not applicable: Secondary | ICD-10-CM | POA: Insufficient documentation

## 2019-12-30 DIAGNOSIS — I10 Essential (primary) hypertension: Secondary | ICD-10-CM | POA: Insufficient documentation

## 2019-12-30 DIAGNOSIS — T148XXA Other injury of unspecified body region, initial encounter: Secondary | ICD-10-CM

## 2019-12-30 DIAGNOSIS — Z87891 Personal history of nicotine dependence: Secondary | ICD-10-CM | POA: Insufficient documentation

## 2019-12-30 DIAGNOSIS — Y9389 Activity, other specified: Secondary | ICD-10-CM | POA: Insufficient documentation

## 2019-12-30 DIAGNOSIS — M25512 Pain in left shoulder: Secondary | ICD-10-CM | POA: Diagnosis present

## 2019-12-30 DIAGNOSIS — T07XXXA Unspecified multiple injuries, initial encounter: Secondary | ICD-10-CM | POA: Insufficient documentation

## 2019-12-30 DIAGNOSIS — M25551 Pain in right hip: Secondary | ICD-10-CM | POA: Diagnosis not present

## 2019-12-30 DIAGNOSIS — M79661 Pain in right lower leg: Secondary | ICD-10-CM | POA: Diagnosis not present

## 2019-12-30 DIAGNOSIS — Y998 Other external cause status: Secondary | ICD-10-CM | POA: Diagnosis not present

## 2019-12-30 MED ORDER — CYCLOBENZAPRINE HCL 10 MG PO TABS: 10.0000 mg | ORAL_TABLET | Freq: Three times a day (TID) | ORAL | 0 refills | Status: DC | PRN
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
## 2019-12-30 NOTE — Discharge Instructions (Signed)
Take muscle relaxer as needed for muscle spasms.  Recommend Tylenol Motrin for pain control.  Follow-up with your primary care doctor for recheck later this week.

## 2020-07-03 DIAGNOSIS — U071 COVID-19: Secondary | ICD-10-CM

## 2020-07-03 HISTORY — DX: COVID-19: U07.1

## 2020-07-11 ENCOUNTER — Telehealth: Payer: Self-pay | Admitting: Nurse Practitioner

## 2020-07-11 ENCOUNTER — Encounter: Payer: Self-pay | Admitting: Nurse Practitioner

## 2020-07-11 DIAGNOSIS — I10 Essential (primary) hypertension: Secondary | ICD-10-CM | POA: Insufficient documentation

## 2020-07-11 NOTE — Telephone Encounter (Signed)
I called Miguel Dibble to discuss Covid symptoms and the use of Sotrovimab, a monoclonal antibody infusion for those with mild to moderate Covid symptoms and at a high risk of hospitalization.     Pt does not qualify for infusion therapy as pt's symptoms first presented > 7 days prior to timing of infusion. Symptoms tier reviewed as well as criteria for ending isolation. Preventative practices reviewed. Patient verbalized understanding   Patient Active Problem List   Diagnosis Date Noted  . Hypertension   . COVID-19 virus infection 07/2020  . Incarcerated right inguinal hernia 12/31/2018  . Chest pain on exertion 08/19/2015  . Shortness of breath 08/19/2015    Nicolasa Ducking, NP

## 2020-11-04 ENCOUNTER — Other Ambulatory Visit: Payer: Self-pay | Admitting: Cardiovascular Disease

## 2020-11-04 ENCOUNTER — Ambulatory Visit
Admission: RE | Admit: 2020-11-04 | Discharge: 2020-11-04 | Disposition: A | Discharge status: Home / Self Care | Payer: Medicare HMO | Source: Ambulatory Visit | Attending: Cardiovascular Disease | Admitting: Cardiovascular Disease

## 2020-11-04 DIAGNOSIS — R200 Anesthesia of skin: Secondary | ICD-10-CM

## 2020-11-04 DIAGNOSIS — M542 Cervicalgia: Secondary | ICD-10-CM

## 2020-11-04 DIAGNOSIS — M79602 Pain in left arm: Secondary | ICD-10-CM

## 2020-11-10 ENCOUNTER — Other Ambulatory Visit: Payer: Self-pay | Admitting: Cardiovascular Disease

## 2020-11-10 DIAGNOSIS — M5090 Cervical disc disorder, unspecified, unspecified cervical region: Secondary | ICD-10-CM

## 2020-11-10 DIAGNOSIS — M4802 Spinal stenosis, cervical region: Secondary | ICD-10-CM

## 2020-11-10 DIAGNOSIS — M5412 Radiculopathy, cervical region: Secondary | ICD-10-CM

## 2020-11-27 ENCOUNTER — Other Ambulatory Visit: Payer: Medicare HMO

## 2021-06-28 IMAGING — CR DG CERVICAL SPINE COMPLETE 4+V
5 series · 5 of 5 positions shown · non-contrast
Comparison: 12/30/2019

CLINICAL DATA: Neck pain left arm numbness.  No recent injury.

EXAM:
CERVICAL SPINE - COMPLETE 4+ VIEW

[w c-spine lat]
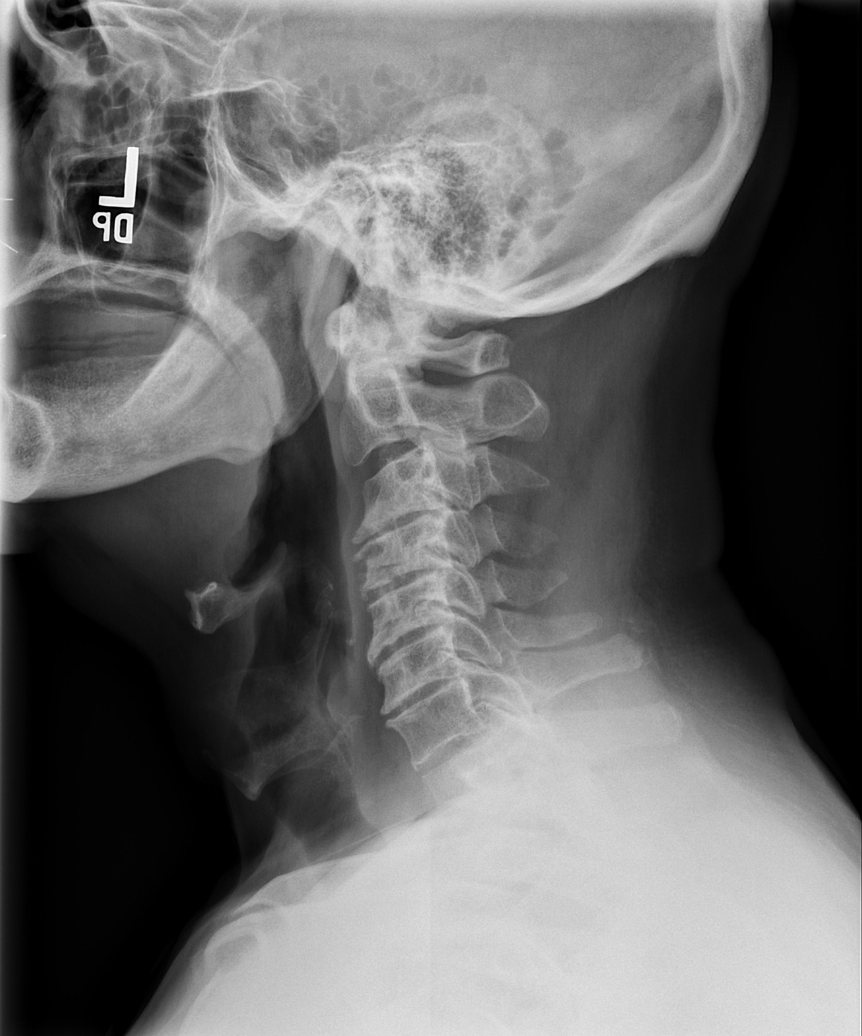

[w c-spine oblique (1 of 2)]
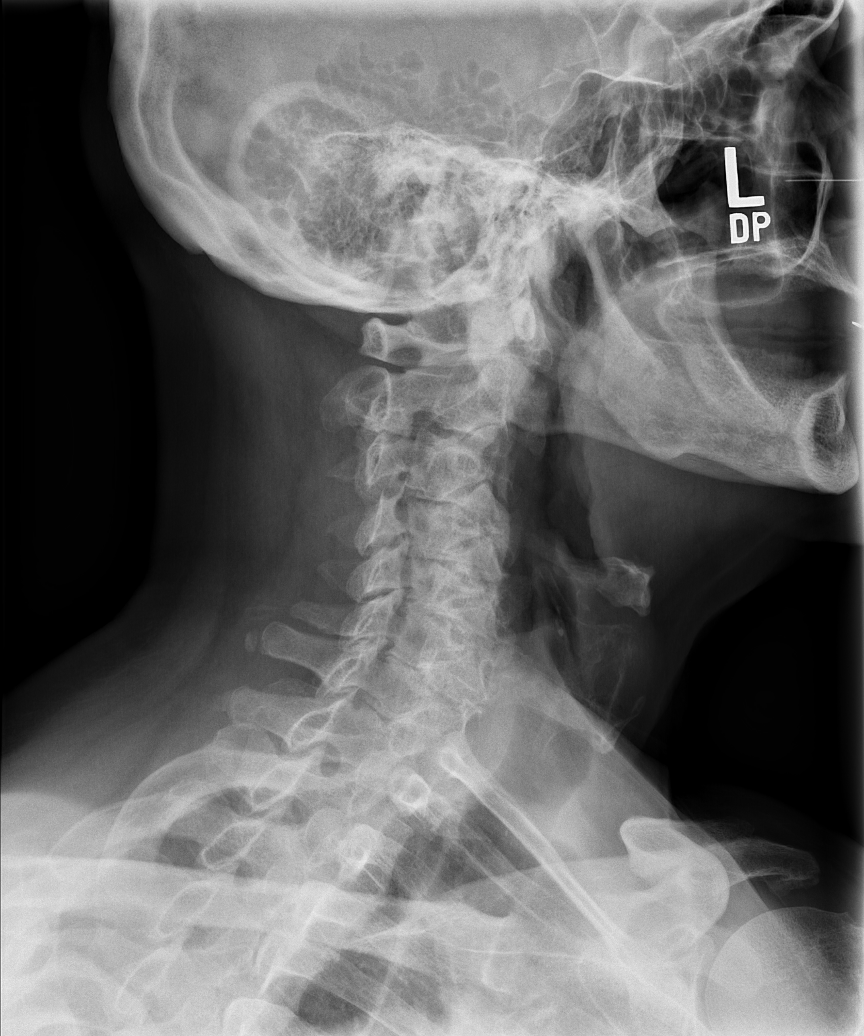

[w c-spine oblique (2 of 2)]
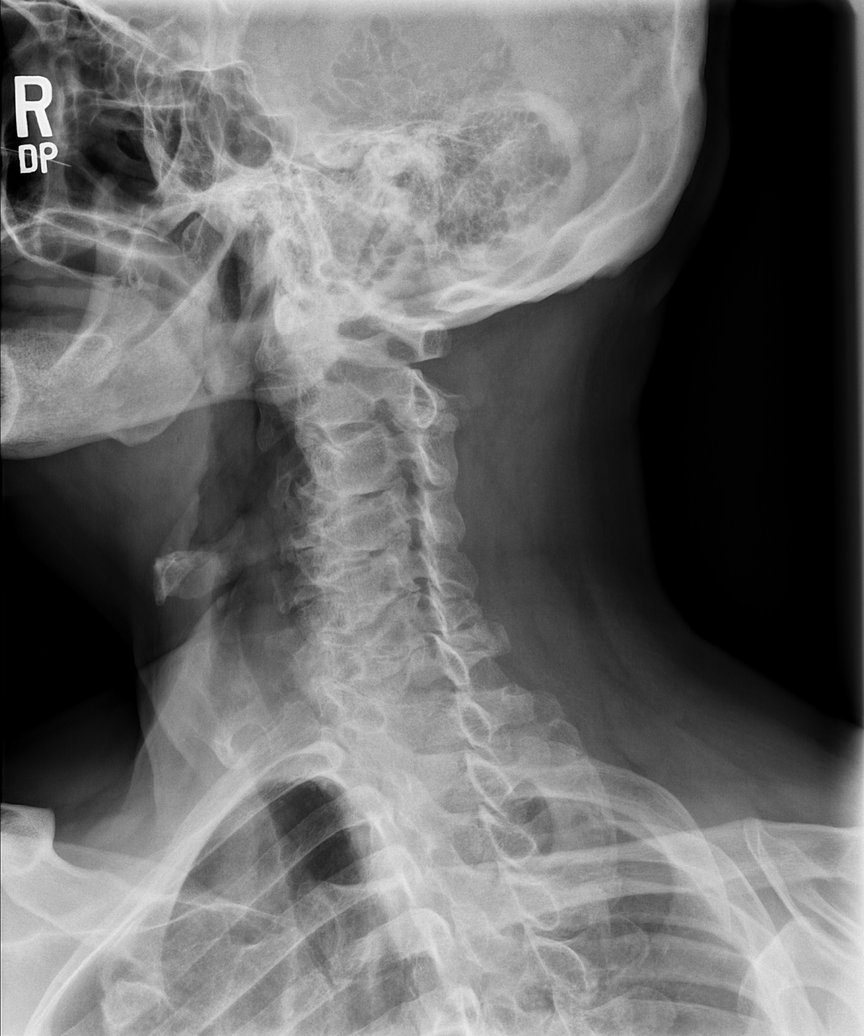

[w c-spine a.p. *]
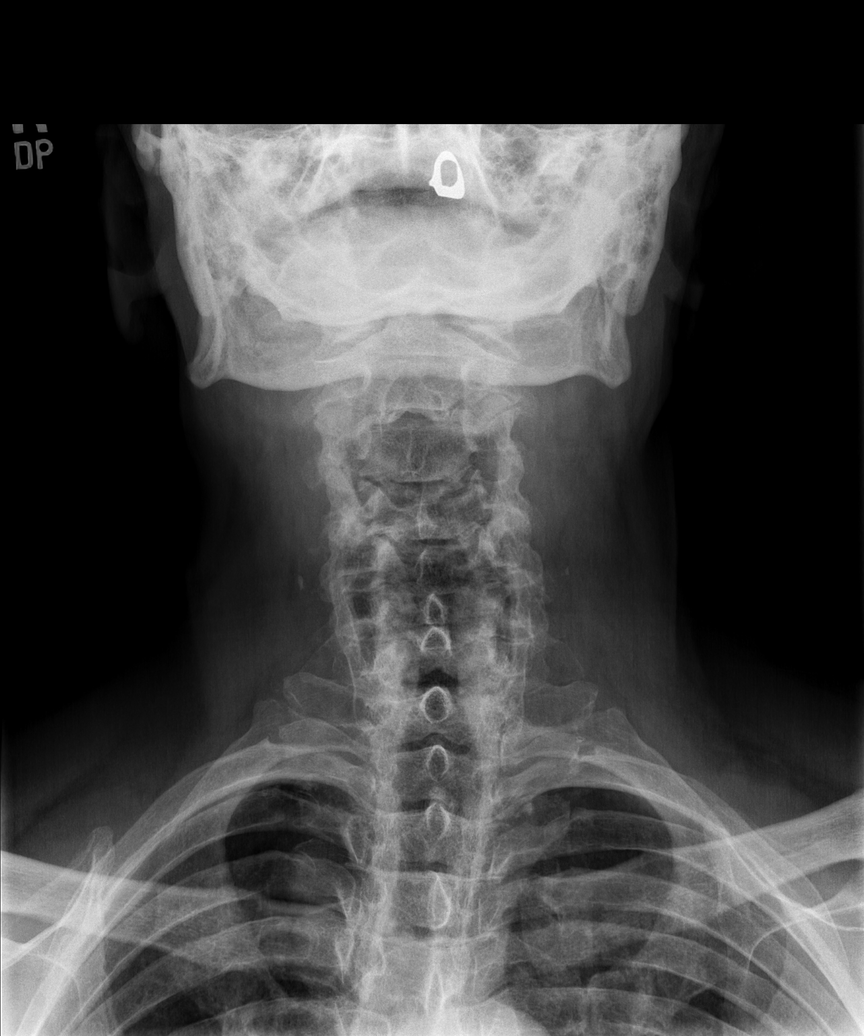

[w c-spine odontoid *]
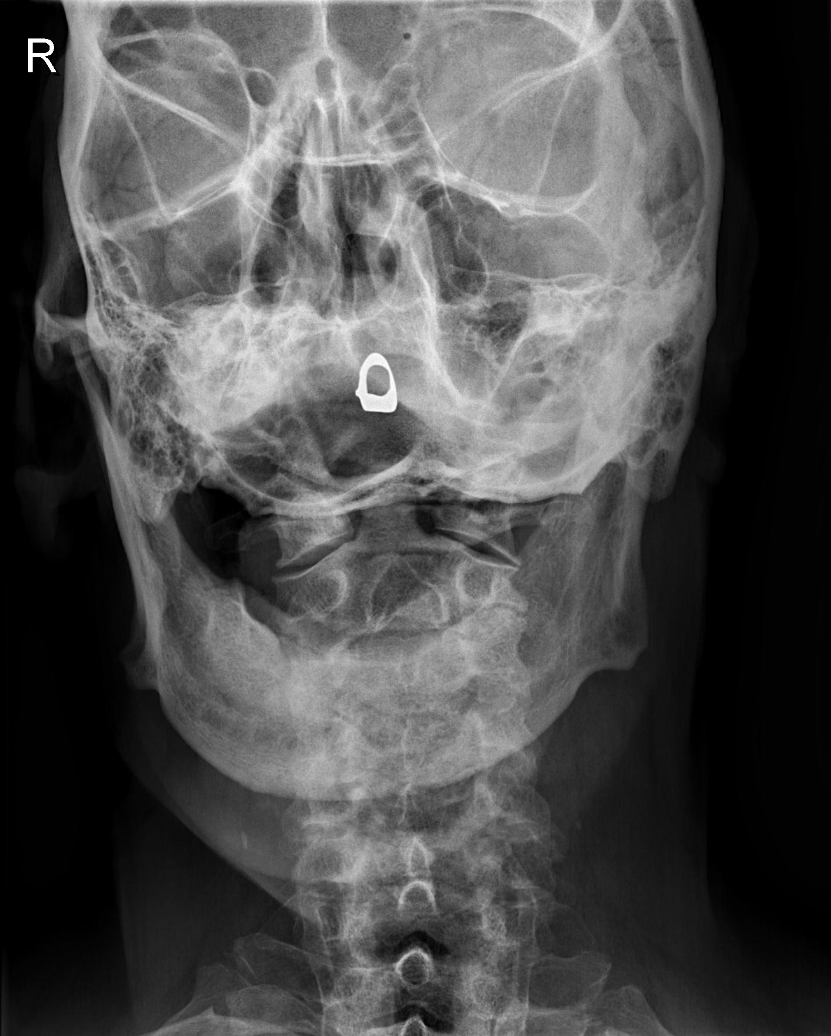

[5 of 5 positions shown; findings below may reference images not displayed]

FINDINGS: Stable reversal of the normal cervical lordosis. Stable mild
retrolisthesis at the C4-5 level. Stable multilevel degenerative
changes. These include stable uncinate spurs producing at least
moderate bilateral foraminal stenosis at all levels. The foramina at
the inferior levels are not as well visualized as the upper levels.
Small amount of bilateral carotid artery calcification.
IMPRESSION: 1. Stable multilevel cervical spine degenerative changes.
2. Small amount of bilateral carotid artery atheromatous
calcification.

## 2023-02-25 ENCOUNTER — Other Ambulatory Visit: Payer: Self-pay

## 2023-02-25 ENCOUNTER — Encounter (HOSPITAL_COMMUNITY): Payer: Self-pay

## 2023-02-25 ENCOUNTER — Observation Stay (HOSPITAL_COMMUNITY)
Admission: EM | Admit: 2023-02-25 | Discharge: 2023-02-26 | Disposition: A | Discharge status: Home / Self Care | Payer: Medicare Other | Attending: Cardiovascular Disease | Admitting: Cardiovascular Disease

## 2023-02-25 ENCOUNTER — Observation Stay (HOSPITAL_COMMUNITY): Payer: Medicare Other

## 2023-02-25 DIAGNOSIS — I441 Atrioventricular block, second degree: Secondary | ICD-10-CM | POA: Diagnosis not present

## 2023-02-25 DIAGNOSIS — D649 Anemia, unspecified: Secondary | ICD-10-CM | POA: Diagnosis not present

## 2023-02-25 DIAGNOSIS — I10 Essential (primary) hypertension: Secondary | ICD-10-CM | POA: Insufficient documentation

## 2023-02-25 DIAGNOSIS — F5101 Primary insomnia: Secondary | ICD-10-CM | POA: Insufficient documentation

## 2023-02-25 DIAGNOSIS — Z87891 Personal history of nicotine dependence: Secondary | ICD-10-CM | POA: Insufficient documentation

## 2023-02-25 DIAGNOSIS — Z7982 Long term (current) use of aspirin: Secondary | ICD-10-CM | POA: Insufficient documentation

## 2023-02-25 DIAGNOSIS — Z8616 Personal history of COVID-19: Secondary | ICD-10-CM | POA: Insufficient documentation

## 2023-02-25 DIAGNOSIS — Z79899 Other long term (current) drug therapy: Secondary | ICD-10-CM | POA: Insufficient documentation

## 2023-02-25 DIAGNOSIS — R001 Bradycardia, unspecified: Secondary | ICD-10-CM

## 2023-02-25 DIAGNOSIS — R531 Weakness: Principal | ICD-10-CM

## 2023-02-25 DIAGNOSIS — R42 Dizziness and giddiness: Principal | ICD-10-CM | POA: Insufficient documentation

## 2023-02-25 DIAGNOSIS — R5383 Other fatigue: Secondary | ICD-10-CM | POA: Diagnosis present

## 2023-02-25 LAB — CBC WITH DIFFERENTIAL/PLATELET
Abs Immature Granulocytes: 0.01 10*3/uL (ref 0.00–0.07)
Basophils Absolute: 0 10*3/uL (ref 0.0–0.1)
Basophils Relative: 1 %
Eosinophils Absolute: 0.3 10*3/uL (ref 0.0–0.5)
Eosinophils Relative: 6 %
HCT: 39.2 % (ref 39.0–52.0)
Hemoglobin: 12.9 g/dL — ABNORMAL LOW (ref 13.0–17.0)
Immature Granulocytes: 0 %
Lymphocytes Relative: 30 %
Lymphs Abs: 1.7 10*3/uL (ref 0.7–4.0)
MCH: 30.1 pg (ref 26.0–34.0)
MCHC: 32.9 g/dL (ref 30.0–36.0)
MCV: 91.6 fL (ref 80.0–100.0)
Monocytes Absolute: 0.5 10*3/uL (ref 0.1–1.0)
Monocytes Relative: 10 %
Neutro Abs: 2.9 10*3/uL (ref 1.7–7.7)
Neutrophils Relative %: 53 %
Platelets: 207 10*3/uL (ref 150–400)
RBC: 4.28 MIL/uL (ref 4.22–5.81)
RDW: 11.9 % (ref 11.5–15.5)
WBC: 5.5 10*3/uL (ref 4.0–10.5)
nRBC: 0 % (ref 0.0–0.2)

## 2023-02-25 LAB — TROPONIN I (HIGH SENSITIVITY)
Troponin I (High Sensitivity): 8 ng/L (ref <18)
Troponin I (High Sensitivity): 8 ng/L (ref <18)

## 2023-02-25 LAB — BASIC METABOLIC PANEL
Anion gap: 9 (ref 5–15)
BUN: 16 mg/dL (ref 8–23)
CO2: 23 mmol/L (ref 22–32)
Calcium: 9.2 mg/dL (ref 8.9–10.3)
Chloride: 105 mmol/L (ref 98–111)
Creatinine, Ser: 1.08 mg/dL (ref 0.61–1.24)
GFR, Estimated: >60 mL/min (ref >60)
Glucose, Bld: 98 mg/dL (ref 70–99)
Potassium: 4.2 mmol/L (ref 3.5–5.1)
Sodium: 137 mmol/L (ref 135–145)

## 2023-02-25 LAB — MAGNESIUM: Magnesium: 2.1 mg/dL (ref 1.7–2.4)

## 2023-02-25 LAB — PHOSPHORUS: Phosphorus: 3.5 mg/dL (ref 2.5–4.6)

## 2023-02-25 LAB — TSH: TSH: 1.537 u[IU]/mL (ref 0.350–4.500)

## 2023-02-25 MED ORDER — SODIUM CHLORIDE 0.9 % IV SOLN: INTRAVENOUS | Status: DC

## 2023-02-25 MED ORDER — HYDRALAZINE HCL 10 MG PO TABS
10.0000 mg | ORAL_TABLET | Freq: Three times a day (TID) | ORAL | Status: DC
  Administered 2023-02-25 – 2023-02-26 (×3): 10 mg via ORAL
  Filled 2023-02-25 (×3): qty 1

## 2023-02-25 MED ORDER — ASPIRIN 81 MG PO TBEC
81.0000 mg | DELAYED_RELEASE_TABLET | Freq: Every day | ORAL | Status: DC
  Administered 2023-02-25 – 2023-02-26 (×2): 81 mg via ORAL
  Filled 2023-02-25 (×2): qty 1

## 2023-02-25 MED ORDER — ATORVASTATIN CALCIUM 10 MG PO TABS: 10.0000 mg | ORAL_TABLET | Freq: Every evening | ORAL | Status: DC

## 2023-02-25 MED ORDER — ZOLPIDEM TARTRATE 5 MG PO TABS
5.0000 mg | ORAL_TABLET | Freq: Every day | ORAL | Status: DC
  Administered 2023-02-25: 5 mg via ORAL
  Filled 2023-02-25: qty 1

## 2023-02-25 NOTE — ED Triage Notes (Signed)
Pt intermittent fatigue x1.5 weeks.  Denies pain and GI/GU symptoms.  Pt reports "I'm tired all the time, but when I sleep, it doesn't feel like I'm getting any rest."  Pt was seen at [facility] prior to arrival and was sent to the ED d/t bradycardia.  Pt's heart rate ranged from 40 to 140s while in triage. Pt denies chest pain or SOB.

## 2023-02-25 NOTE — ED Provider Notes (Signed)
[place] EMERGENCY DEPARTMENT AT [facility] Provider Note   CSN: [id number] Arrival date & time: 02/25/23  1521     History  Chief Complaint  Patient presents with   Fatigue    Austin Harrington is a 72 y.o. male.  HPI    72 year old male comes in with chief complaint of generalized weakness.  Patient has past medical history of hypertension, hyperlipidemia and known second-degree type I AV block.  He comes in because over the last several days he has had episodes of weakness, dizziness and shortness of breath.  Patient states that he is not always feeling weak, but more often than not he feels tired.  Sometimes he takes rest and feels better.  He denies any exertional chest pain, shortness of breath, dizziness or near fainting.  Patient denies any chest pain.  He was seen at urgent care and was advised to come to the ER because of low heart rate.  Patient states that he went to the ER today because he is just not getting better.  Home Medications Prior to Admission medications   Medication Sig Start Date End Date Taking? Authorizing Provider  amLODipine (NORVASC) 2.5 MG tablet Take 2.5 mg by mouth daily. 05/16/16   [provider]  aspirin 81 MG tablet Take 81 mg by mouth daily.     [provider]  atorvastatin (LIPITOR) 10 MG tablet Take 10 mg by mouth every evening. 05/14/17   [provider]  cyclobenzaprine (FLEXERIL) 10 MG tablet Take 1 tablet (10 mg total) by mouth 3 (three) times daily as needed for muscle spasms. 12/30/19   Milagros Loll, MD  hydroxypropyl methylcellulose / hypromellose (ISOPTO TEARS / GONIOVISC) 2.5 % ophthalmic solution Place 1 drop into both eyes 3 (three) times daily as needed for dry eyes.    [provider]  oxyCODONE (OXY IR/ROXICODONE) 5 MG immediate release tablet Take 1 tablet (5 mg total) by mouth every 6 (six) hours as needed for severe pain. 01/01/19   Manus Rudd, MD      Allergies     Patient has no known allergies.    Review of Systems   Review of Systems  All other systems reviewed and are negative.   Physical Exam Updated Vital Signs BP 103/89   Pulse (!) 53   Temp 98 F (36.7 C) (Oral)   Resp 20   Ht 5\' 8"  (1.727 m)   Wt 97.5 kg   SpO2 91%   BMI 32.69 kg/m  Physical Exam Vitals and nursing note reviewed.  Constitutional:      Appearance: He is well-developed.  HENT:     Head: Atraumatic.  Eyes:     Extraocular Movements: Extraocular movements intact.     Pupils: Pupils are equal, round, and reactive to light.  Cardiovascular:     Rate and Rhythm: Bradycardia present. Rhythm irregular.  Pulmonary:     Effort: Pulmonary effort is normal.  Abdominal:     Tenderness: There is no abdominal tenderness.  Musculoskeletal:     Cervical back: Neck supple.  Skin:    General: Skin is warm.  Neurological:     Mental Status: He is alert and oriented to person, place, and time.     ED Results / Procedures / Treatments   Labs (all labs ordered are listed, but only abnormal results are displayed) Labs Reviewed  CBC WITH DIFFERENTIAL/PLATELET - Abnormal; Notable for the following components:      Result Value  Hemoglobin 12.9 (*)    All other components within normal limits  BASIC METABOLIC PANEL  MAGNESIUM  PHOSPHORUS  TSH  TROPONIN I (HIGH SENSITIVITY)  TROPONIN I (HIGH SENSITIVITY)    EKG EKG Interpretation Date/Time:  Sunday February 25 2023 18:19:20 EDT Ventricular Rate:  76 PR Interval:    QRS Duration:  143 QT Interval:  543 QTC Calculation: 460 R Axis:   21  Text Interpretation: Sinus bradycardia with HR of 42 Second deg AVB, Mobitz I (Wenckebach) - not new Probable left atrial enlargement Left bundle branch block Confirmed by Derwood Kaplan [phone]) on 02/25/2023 6:27:25 PM  Radiology No results found.  Procedures .Critical Care  Performed by: Derwood Kaplan, MD Authorized by: Derwood Kaplan, MD   Critical care provider  statement:    Critical care time (minutes):  36   Critical care was necessary to treat or prevent imminent or life-threatening deterioration of the following conditions:  Circulatory failure   Critical care was time spent personally by me on the following activities:  Development of treatment plan with patient or surrogate, discussions with consultants, evaluation of patient's response to treatment, examination of patient, ordering and review of laboratory studies, ordering and review of radiographic studies, ordering and performing treatments and interventions, pulse oximetry, re-evaluation of patient's condition and review of old charts     Medications Ordered in ED Medications - No data to display  ED Course/ Medical Decision Making/ A&P Clinical Course as of 02/25/23 2002  Sun Feb 25, 2023  2001 Dr. Algie Coffer has seen the patient. Patient remained stable.  Heart rate has been mostly in the 40s.  On occasion the heart rate has dropped to 35-40 range. However, patient has not had any mental status changes and his lab workup is completely reassuring.  Patient still has secondary type I AV block.  Dr. Algie Coffer to admit him. [AN]    Clinical Course User Index [AN] Derwood Kaplan, MD                                 Medical Decision Making Amount and/or Complexity of Data Reviewed Labs: ordered.  Risk Decision regarding hospitalization.   72 year old male comes in with chief complaint of generalized weakness.  Patient has known history of hypertension, hyperlipidemia secondary to type I block.  According to triage note, patient's heart rate has gone for between 40-1 40.  On my assessment, patient is bradycardic.  He is in second-degree type I block.  I suspect that he is symptomatic from it.  Differential diagnosis as to the cause of second-degree type I AV block is severe electrolyte abnormality, dehydration, tickborne illness, ischemic heart disease.  I have reviewed patient's  records including cardiology note from 2020.  Patient sees Dr. Algie Coffer.  He specifically documents that patient has transient second-degree type I AV block.  I have reviewed patient's previous EKGs and his heart rate always is in the 50s.  Therefore, it is possible that he has tacky bradycardia syndrome (since it appears that his heart rate is jumping) or worsening secondary to type I AV block.  Will put cardiac pads on him.  Will get basic labs and consult cardiology.  8:02 PM I consulted and spoke with Dr. Algie Coffer from cardiology.  He will come and see the patient.  Final Clinical Impression(s) / ED Diagnoses Final diagnoses:  Weakness  2nd degree AV block  Bradycardia    Rx / DC  Orders ED Discharge Orders     None         Derwood Kaplan, MD 02/25/23 2002

## 2023-02-25 NOTE — ED Notes (Signed)
ED TO INPATIENT HANDOFF REPORT  ED Nurse Name and Phone #:  [phone]  S Name/Age/Gender Austin Harrington 72 y.o. male Room/Bed: 005C/005C  Code Status   Code Status: Prior  Home/SNF/Other Home Patient oriented to: self, place, time, and situation Is this baseline? Yes   Triage Complete: Triage complete  Chief Complaint Dizziness [R42]  Triage Note Pt intermittent fatigue x1.5 weeks.  Denies pain and GI/GU symptoms.  Pt reports "I'm tired all the time, but when I sleep, it doesn't feel like I'm getting any rest."  Pt was seen at [facility] prior to arrival and was sent to the ED d/t bradycardia.  Pt's heart rate ranged from 40 to 140s while in triage. Pt denies chest pain or SOB.    Allergies No Known Allergies  Level of Care/Admitting Diagnosis ED Disposition     ED Disposition  Admit   Condition  --   Comment  Hospital Area: [facility] [[id number]]  Level of Care: Telemetry Cardiac [103]  May place patient in observation at [facility] or [facility] if equivalent level of care is available:: No  Covid Evaluation: Asymptomatic - no recent exposure (last 10 days) testing not required  Diagnosis: Dizziness [098119]  Admitting Physician: [name] [[id number]]  Attending Physician: [name] [[id number]]          B Medical/Surgery History Past Medical History:  Diagnosis Date   Arthritis    COVID-19 virus infection 07/2020   History of vertebral fracture    Hypertension    Past Surgical History:  Procedure Laterality Date   BACK SURGERY     CARDIAC CATHETERIZATION N/A 08/19/2015   Procedure: Left Heart Cath and Coronary Angiography;  Surgeon: [name], MD;  [facility] INVASIVE CV LAB;  Service: Cardiovascular;  Laterality: N/A;   INGUINAL HERNIA REPAIR  12/31/2018   INGUINAL HERNIA REPAIR Right 12/31/2018   Procedure: RIGHT HERNIA REPAIR INGUINAL ADULT;  Surgeon: Manus Rudd, MD;  Location: [facility] OR;  Service: General;  Laterality: Right;   GENERAL AND TAP BLOCK ANESTHESIA   INSERTION OF MESH Right 12/31/2018   Procedure: INSERTION OF MESH;  Surgeon: Manus Rudd, MD;  Location: [facility] OR;  Service: General;  Laterality: Right;     A IV Location/Drains/Wounds Patient Lines/Drains/Airways Status     Active Line/Drains/Airways     Name Placement date Placement time Site Days   Peripheral IV 02/25/23 18 G Left Antecubital 02/25/23  1614  Antecubital  less than 1   Incision (Closed) 12/31/18 Groin Right 12/31/18  0946  -- 1517            Intake/Output Last 24 hours No intake or output data in the 24 hours ending 02/25/23 2016  Labs/Imaging Results for orders placed or performed during the hospital encounter of 02/25/23 (from the past 48 hour(s))  Basic metabolic panel     Status: None   Collection Time: 02/25/23  4:16 PM  Result Value Ref Range   Sodium 137 135 - 145 mmol/L   Potassium 4.2 3.5 - 5.1 mmol/L   Chloride 105 98 - 111 mmol/L   CO2 23 22 - 32 mmol/L   Glucose, Bld 98 70 - 99 mg/dL    Comment: Glucose reference range applies only to samples taken after fasting for at least 8 hours.   BUN 16 8 - 23 mg/dL   Creatinine, Ser 1.47 0.61 - 1.24 mg/dL   Calcium 9.2 8.9 - 82.9 mg/dL   GFR, Estimated >56 >21 mL/min  Comment: (NOTE) Calculated using the CKD-EPI Creatinine Equation (2021)    Anion gap 9 5 - 15    Comment: Performed at [facility], [address] [place], [place] [address]  CBC with Differential     Status: Abnormal   Collection Time: 02/25/23  4:16 PM  Result Value Ref Range   WBC 5.5 4.0 - 10.5 K/uL   RBC 4.28 4.22 - 5.81 MIL/uL   Hemoglobin 12.9 (L) 13.0 - 17.0 g/dL   HCT 19.1 47.8 - 29.5 %   MCV 91.6 80.0 - 100.0 fL   MCH 30.1 26.0 - 34.0 pg   MCHC 32.9 30.0 - 36.0 g/dL   RDW 62.1 30.8 - 65.7 %   Platelets 207 150 - 400 K/uL   nRBC 0.0 0.0 - 0.2 %   Neutrophils Relative % 53 %   Neutro Abs 2.9 1.7 - 7.7 K/uL   Lymphocytes Relative 30 %   Lymphs Abs 1.7 0.7 - 4.0 K/uL    Monocytes Relative 10 %   Monocytes Absolute 0.5 0.1 - 1.0 K/uL   Eosinophils Relative 6 %   Eosinophils Absolute 0.3 0.0 - 0.5 K/uL   Basophils Relative 1 %   Basophils Absolute 0.0 0.0 - 0.1 K/uL   Immature Granulocytes 0 %   Abs Immature Granulocytes 0.01 0.00 - 0.07 K/uL    Comment: Performed at [facility], [address], [place], [place] [address]  Troponin I (High Sensitivity)     Status: None   Collection Time: 02/25/23  4:16 PM  Result Value Ref Range   Troponin I (High Sensitivity) 8 <18 ng/L    Comment: (NOTE) Elevated high sensitivity troponin I (hsTnI) values and significant  changes across serial measurements may suggest ACS but many other  chronic and acute conditions are known to elevate hsTnI results.  Refer to the "Links" section for chest pain algorithms and additional  guidance. Performed at [facility], [address], [place], [place] [address]   Magnesium     Status: None   Collection Time: 02/25/23  4:16 PM  Result Value Ref Range   Magnesium 2.1 1.7 - 2.4 mg/dL    Comment: Performed at [facility], [address], [place], [place] [address]  Phosphorus     Status: None   Collection Time: 02/25/23  4:16 PM  Result Value Ref Range   Phosphorus 3.5 2.5 - 4.6 mg/dL    Comment: Performed at [facility], [address], [place], [place] [address]  Troponin I (High Sensitivity)     Status: None   Collection Time: 02/25/23  6:28 PM  Result Value Ref Range   Troponin I (High Sensitivity) 8 <18 ng/L    Comment: (NOTE) Elevated high sensitivity troponin I (hsTnI) values and significant  changes across serial measurements may suggest ACS but many other  chronic and acute conditions are known to elevate hsTnI results.  Refer to the "Links" section for chest pain algorithms and additional  guidance. Performed at [facility], [address] [place], [place] [address]    No results found.  Pending Labs Unresulted Labs  (From admission, onward)     Start     Ordered   02/25/23 1956  TSH  Add-on,   AD        02/25/23 1955            Vitals/Pain Today's Vitals   02/25/23 1730 02/25/23 1828 02/25/23  1915 02/25/23 1930  BP: (!) 123/52 (!) 120/55 133/68 103/89  Pulse: (!) 52 (!) 40 (!) 40 (!) 53  Resp: (!) 23 14 19 20   Temp:  98 F (36.7 C)    TempSrc:  Oral    SpO2: 100% 100% 100% 91%  Weight:      Height:      PainSc:        Isolation Precautions No active isolations  Medications Medications  aspirin EC tablet 81 mg (has no administration in time range)  atorvastatin (LIPITOR) tablet 10 mg (has no administration in time range)  hydrALAZINE (APRESOLINE) tablet 10 mg (has no administration in time range)  0.9 %  sodium chloride infusion (has no administration in time range)  zolpidem (AMBIEN) tablet 5 mg (has no administration in time range)    Mobility walks     Focused Assessments Cardiac Assessment Handoff:  Cardiac Rhythm: Heart block  Does the Patient currently have chest pain? No    R Recommendations: See Admitting Provider Note  Report given to:   Additional Notes:

## 2023-02-25 NOTE — H&P (Signed)
Referring Physician: Derwood Kaplan  Austin Harrington is an 72 y.o. male.                       Chief Complaint: Weakness and dizziness  HPI: 72 years old black male with PMH of 2nd degree type 1 AV block, HTN, Arthritis and Right inguinal hernia repair has episodes of weakness, dizziness and lack of adequate sleep.  He denies GU or GI bleed. His Heart rate runs in 40's to 50's without hypotension.  He is not on B-blocker but takes amlodipine.  Past Medical History:  Diagnosis Date   Arthritis    COVID-19 virus infection 07/2020   History of vertebral fracture    Hypertension       Past Surgical History:  Procedure Laterality Date   BACK SURGERY     CARDIAC CATHETERIZATION N/A 08/19/2015   Procedure: Left Heart Cath and Coronary Angiography;  Surgeon: [name], MD;  Location: [facility];  Service: Cardiovascular;  Laterality: N/A;   INGUINAL HERNIA REPAIR  12/31/2018   INGUINAL HERNIA REPAIR Right 12/31/2018   Procedure: RIGHT HERNIA REPAIR INGUINAL ADULT;  Surgeon: Manus Rudd, MD;  Location: [facility] OR;  Service: General;  Laterality: Right;  GENERAL AND TAP BLOCK ANESTHESIA   INSERTION OF MESH Right 12/31/2018   Procedure: INSERTION OF MESH;  Surgeon: Manus Rudd, MD;  Location: [facility] OR;  Service: General;  Laterality: Right;    History reviewed. No pertinent family history. Social History:  reports that he quit smoking about 39 years ago. He has quit using smokeless tobacco. He reports that he does not drink alcohol and does not use drugs.  Allergies: No Known Allergies  (Not in a hospital admission)   Results for orders placed or performed during the hospital encounter of 02/25/23 (from the past 48 hour(s))  Basic metabolic panel     Status: None   Collection Time: 02/25/23  4:16 PM  Result Value Ref Range   Sodium 137 135 - 145 mmol/L   Potassium 4.2 3.5 - 5.1 mmol/L   Chloride 105 98 - 111 mmol/L   CO2 23 22 - 32 mmol/L   Glucose, Bld 98 70 - 99 mg/dL     Comment: Glucose reference range applies only to samples taken after fasting for at least 8 hours.   BUN 16 8 - 23 mg/dL   Creatinine, Ser 1.61 0.61 - 1.24 mg/dL   Calcium 9.2 8.9 - 09.6 mg/dL   GFR, Estimated >04 >54 mL/min    Comment: (NOTE) Calculated using the CKD-EPI Creatinine Equation (2021)    Anion gap 9 5 - 15    Comment: Performed at [facility], [address] [place], [place] [address]  CBC with Differential     Status: Abnormal   Collection Time: 02/25/23  4:16 PM  Result Value Ref Range   WBC 5.5 4.0 - 10.5 K/uL   RBC 4.28 4.22 - 5.81 MIL/uL   Hemoglobin 12.9 (L) 13.0 - 17.0 g/dL   HCT 91.4 78.2 - 95.6 %   MCV 91.6 80.0 - 100.0 fL   MCH 30.1 26.0 - 34.0 pg   MCHC 32.9 30.0 - 36.0 g/dL   RDW 21.3 08.6 - 57.8 %   Platelets 207 150 - 400 K/uL   nRBC 0.0 0.0 - 0.2 %   Neutrophils Relative % 53 %   Neutro Abs 2.9 1.7 - 7.7 K/uL   Lymphocytes Relative 30 %  Lymphs Abs 1.7 0.7 - 4.0 K/uL   Monocytes Relative 10 %   Monocytes Absolute 0.5 0.1 - 1.0 K/uL   Eosinophils Relative 6 %   Eosinophils Absolute 0.3 0.0 - 0.5 K/uL   Basophils Relative 1 %   Basophils Absolute 0.0 0.0 - 0.1 K/uL   Immature Granulocytes 0 %   Abs Immature Granulocytes 0.01 0.00 - 0.07 K/uL    Comment: Performed at [facility], [address], [place], [place] [address]  Troponin I (High Sensitivity)     Status: None   Collection Time: 02/25/23  4:16 PM  Result Value Ref Range   Troponin I (High Sensitivity) 8 <18 ng/L    Comment: (NOTE) Elevated high sensitivity troponin I (hsTnI) values and significant  changes across serial measurements may suggest ACS but many other  chronic and acute conditions are known to elevate hsTnI results.  Refer to the "Links" section for chest pain algorithms and additional  guidance. Performed at [facility], [address], [place], [place] [address]   Magnesium     Status: None   Collection Time: 02/25/23  4:16 PM  Result Value Ref  Range   Magnesium 2.1 1.7 - 2.4 mg/dL    Comment: Performed at [facility], [address] [place], [place] [address]  Phosphorus     Status: None   Collection Time: 02/25/23  4:16 PM  Result Value Ref Range   Phosphorus 3.5 2.5 - 4.6 mg/dL    Comment: Performed at [facility], [address], [place], [place] [address]  Troponin I (High Sensitivity)     Status: None   Collection Time: 02/25/23  6:28 PM  Result Value Ref Range   Troponin I (High Sensitivity) 8 <18 ng/L    Comment: (NOTE) Elevated high sensitivity troponin I (hsTnI) values and significant  changes across serial measurements may suggest ACS but many other  chronic and acute conditions are known to elevate hsTnI results.  Refer to the "Links" section for chest pain algorithms and additional  guidance. Performed at [facility], [address], [place], [place] [address]    No results found.  Review Of Systems Constitutional: No fever, chills, weight loss or gain. Eyes: No vision change, wears glasses. No discharge or pain. Ears: No hearing loss, No tinnitus. Respiratory: No asthma, COPD, pneumonias. Positive shortness of breath. No hemoptysis. Cardiovascular: No chest pain, palpitation, leg edema. Gastrointestinal: No nausea, vomiting, diarrhea, constipation. No GI bleed. No hepatitis. Genitourinary: No dysuria, hematuria, kidney stone. No incontinance. Neurological: No headache, stroke, seizures.  Psychiatry: No psych facility admission for anxiety, depression, suicide. No detox. Skin: No rash. Musculoskeletal: Positive joint pain, no fibromyalgia. No neck pain, positive back pain. Lymphadenopathy: No lymphadenopathy. Hematology: No anemia or easy bruising.   Blood pressure 103/89, pulse (!) 53, temperature 98 F (36.7 C), temperature source Oral, resp. rate 20, height 5\' 8"  (1.727 m), weight 97.5 kg, SpO2 91%. Body mass index is 32.69 kg/m. General appearance: alert, cooperative, appears  stated age and no distress Head: Normocephalic, atraumatic. Eyes: Brown eyes, pink conjunctiva, corneas clear.  Neck: No adenopathy, no carotid bruit, no JVD, supple, symmetrical, trachea midline and thyroid not enlarged. Resp: Clear to auscultation bilaterally. Cardio: Irregular rate and rhythm, S1, S2 normal, II/VI systolic murmur, no click, rub or gallop GI: Soft, non-tender; bowel sounds normal; no organomegaly. Extremities: No edema, cyanosis or clubbing. Skin: Warm and dry.  Neurologic: Alert and oriented  X 3, normal strength. Normal coordination.  Assessment/Plan Weakness 2nd degree type 1 AV block Insomnia, primary HTN Arthritis  Plan: Cardiac monitor. Check TSH. Hold amlodipine. Add small dose Hydralazine. Chest x-ray for shortness of breath. Ambien for sleep  Time spent: Review of old records, Lab, x-rays, EKG, other cardiac tests, examination, discussion with patient/Doctor over 70 minutes.  Ricki Rodriguez, MD  02/25/2023, 8:07 PM

## 2023-02-26 LAB — CBC
HCT: 35.7 % — ABNORMAL LOW (ref 39.0–52.0)
Hemoglobin: 11.8 g/dL — ABNORMAL LOW (ref 13.0–17.0)
MCH: 30.3 pg (ref 26.0–34.0)
MCHC: 33.1 g/dL (ref 30.0–36.0)
MCV: 91.8 fL (ref 80.0–100.0)
Platelets: 184 10*3/uL (ref 150–400)
RBC: 3.89 MIL/uL — ABNORMAL LOW (ref 4.22–5.81)
RDW: 12 % (ref 11.5–15.5)
WBC: 5.6 10*3/uL (ref 4.0–10.5)
nRBC: 0 % (ref 0.0–0.2)

## 2023-02-26 LAB — BASIC METABOLIC PANEL
Anion gap: 8 (ref 5–15)
BUN: 13 mg/dL (ref 8–23)
CO2: 24 mmol/L (ref 22–32)
Calcium: 8.6 mg/dL — ABNORMAL LOW (ref 8.9–10.3)
Chloride: 105 mmol/L (ref 98–111)
Creatinine, Ser: 0.99 mg/dL (ref 0.61–1.24)
GFR, Estimated: >60 mL/min (ref >60)
Glucose, Bld: 91 mg/dL (ref 70–99)
Potassium: 3.8 mmol/L (ref 3.5–5.1)
Sodium: 137 mmol/L (ref 135–145)

## 2023-02-26 LAB — IRON AND TIBC
Iron: 64 ug/dL (ref 45–182)
Saturation Ratios: 23 % (ref 17.9–39.5)
TIBC: 273 ug/dL (ref 250–450)
UIBC: 209 ug/dL

## 2023-02-26 LAB — FERRITIN: Ferritin: 420 ng/mL — ABNORMAL HIGH (ref 24–336)

## 2023-02-26 MED ORDER — FERROUS SULFATE 325 (65 FE) MG PO TABS: 325.0000 mg | ORAL_TABLET | Freq: Every day | ORAL | 3 refills | Status: DC

## 2023-02-26 MED ORDER — VITAMIN C 500 MG PO TABS: 250.0000 mg | ORAL_TABLET | Freq: Every day | ORAL | Status: DC

## 2023-02-26 MED ORDER — HYDRALAZINE HCL 10 MG PO TABS: 10.0000 mg | ORAL_TABLET | Freq: Two times a day (BID) | ORAL | 3 refills | Status: DC

## 2023-02-26 MED ORDER — FERROUS SULFATE 325 (65 FE) MG PO TABS: 325.0000 mg | ORAL_TABLET | Freq: Every day | ORAL | Status: DC

## 2023-02-26 MED ORDER — ATORVASTATIN CALCIUM 20 MG PO TABS: 20.0000 mg | ORAL_TABLET | Freq: Every evening | ORAL | 3 refills | Status: DC

## 2023-02-26 MED ORDER — ASCORBIC ACID 250 MG PO TABS: 250.0000 mg | ORAL_TABLET | Freq: Every day | ORAL | 3 refills | Status: DC

## 2023-02-26 MED ORDER — ENOXAPARIN SODIUM 40 MG/0.4ML IJ SOSY
40.0000 mg | PREFILLED_SYRINGE | Freq: Every day | INTRAMUSCULAR | Status: DC
  Administered 2023-02-26: 40 mg via SUBCUTANEOUS
  Filled 2023-02-26: qty 0.4

## 2023-02-26 NOTE — Plan of Care (Signed)

## 2023-02-26 NOTE — Plan of Care (Signed)
  Problem: Education: Goal: Knowledge of General Education information will improve Description: Including pain rating scale, medication(s)/side effects and non-pharmacologic comfort measures 02/26/2023 1720 by Zada Finders, RN Outcome: Adequate for Discharge 02/26/2023 1642 by Zada Finders, RN Outcome: Progressing   Problem: Health Behavior/Discharge Planning: Goal: Ability to manage health-related needs will improve 02/26/2023 1720 by Zada Finders, RN Outcome: Adequate for Discharge 02/26/2023 1642 by Zada Finders, RN Outcome: Progressing   Problem: Clinical Measurements: Goal: Ability to maintain clinical measurements within normal limits will improve 02/26/2023 1720 by Lorrin Jackson D, RN Outcome: Adequate for Discharge 02/26/2023 1642 by Zada Finders, RN Outcome: Progressing Goal: Will remain free from infection Outcome: Adequate for Discharge Goal: Diagnostic test results will improve Outcome: Adequate for Discharge Goal: Respiratory complications will improve Outcome: Adequate for Discharge Goal: Cardiovascular complication will be avoided Outcome: Adequate for Discharge   Problem: Activity: Goal: Risk for activity intolerance will decrease Outcome: Adequate for Discharge   Problem: Nutrition: Goal: Adequate nutrition will be maintained Outcome: Adequate for Discharge   Problem: Coping: Goal: Level of anxiety will decrease Outcome: Adequate for Discharge   Problem: Elimination: Goal: Will not experience complications related to bowel motility Outcome: Adequate for Discharge Goal: Will not experience complications related to urinary retention Outcome: Adequate for Discharge   Problem: Pain Managment: Goal: General experience of comfort will improve Outcome: Adequate for Discharge   Problem: Safety: Goal: Ability to remain free from injury will improve Outcome: Adequate for Discharge   Problem: Skin Integrity: Goal: Risk  for impaired skin integrity will decrease Outcome: Adequate for Discharge

## 2023-02-26 NOTE — Discharge Summary (Signed)
Physician Discharge Summary  Patient ID: Austin Harrington MRN: [id number] DOB/AGE: 12-08-1950 72 y.o.  Admit date: 02/25/2023 Discharge date: 02/26/2023  Admission Diagnoses: Weakness 2nd degree AV block, type 1 Primary insomnia Hypertension Arthritis  Discharge Diagnoses:  Principal Problem:   Dizziness Active Problems: Weakness 2nd degree AV block, type 1 Insomnia, primary Hypertension Arthritis Anemia, unspecified  Discharged Condition: good  Hospital Course: 72 years old black male with PMH of 2nd degree type 1 AV block, HTN, Arthritis and Right inguinal hernia repair has episodes of weakness, dizziness and lack of adequate sleep.  He denies GU or GI bleed. His Heart rate at rest runs in 40's to 50's without hypotension.  He is not on B-blocker but takes amlodipine. His amlodipine was discontinued and small dose hydralazine was started. Heart rate improved to 70's/min with 150 feet walk. He ambulated without support and without feeling dizziness. His iron studies are pending. He was started on low dose vitamin C and ferrous sulfate 325 mg. Daily. He will use OTC sleeping medication as needed. He will f/u with me in 1 week. He will have OP GI consult if Hgb do not improve in 1 month.  Consults: cardiology  Significant Diagnostic Studies: labs: Mild anemia, normal BMET, TSH and troponin I levels.  EKG: Sinus bradycardia with 2nd degree AV block, type 1.  CXR: Unremarkable.  Treatments: cardiac meds: Hydralazine and atorvastatin. Iron pill + vitamin C.  Discharge Exam: Blood pressure (!) 107/45, pulse (!) 48, temperature 97.9 F (36.6 C), temperature source Oral, resp. rate 18, height 5\' 8"  (1.727 m), weight 86 kg, SpO2 100%. General appearance: alert, cooperative and appears stated age. Head: Normocephalic, atraumatic. Eyes: Brown eyes, pink conjunctiva, corneas clear.   Neck: No adenopathy, no carotid bruit, no JVD, supple, symmetrical, trachea midline and thyroid  not enlarged. Resp: Clear to auscultation bilaterally. Cardio: Irregular rate and rhythm, S1, S2 normal, II/VI systolic murmur, no click, rub or gallop. GI: Soft, non-tender; bowel sounds normal; no organomegaly. Extremities: No edema, cyanosis or clubbing. Skin: Warm and dry.  Neurologic: Alert and oriented X 3, normal strength and tone. Normal coordination and gait.  Disposition: Discharge disposition: 01-Home or Self Care        Allergies as of 02/26/2023   No Known Allergies      Medication List     STOP taking these medications    amLODipine 2.5 MG tablet Commonly known as: NORVASC       TAKE these medications    ascorbic acid 250 MG tablet Commonly known as: VITAMIN C Take 1 tablet (250 mg total) by mouth daily with breakfast. Start taking on: February 27, 2023   atorvastatin 20 MG tablet Commonly known as: LIPITOR Take 1 tablet (20 mg total) by mouth every evening. What changed:  medication strength how much to take when to take this   ferrous sulfate 325 (65 FE) MG tablet Take 1 tablet (325 mg total) by mouth daily with breakfast. Start taking on: February 27, 2023   hydrALAZINE 10 MG tablet Commonly known as: APRESOLINE Take 1 tablet (10 mg total) by mouth 2 (two) times daily.        Follow-up Information     [name], MD Follow up in 1 week(s).   Specialty: Cardiology Contact information: [address] [place] [address] [phone]                 Time spent: Review of old chart, current chart, lab, x-ray, cardiac tests  and discussion with patient over 60 minutes.  Signed: Ricki Rodriguez 02/26/2023, 5:12 PM

## 2023-02-26 NOTE — Plan of Care (Signed)
°  Problem: Coping: °Goal: Level of anxiety will decrease °Outcome: Progressing °  °

## 2023-02-26 NOTE — TOC Initial Note (Signed)
Transition of Care [facility]) - Initial/Assessment Note    Patient Details  Name: Austin Harrington MRN: [id number] Date of Birth: [birth year]  [facility]) CM/SW Contact:    Leone Haven, RN Phone Number: 02/26/2023, 3:54 PM  Clinical Narrative:                 From home alone, has PCP and insurance on file, states has no HH services in place at this time or DME at home.  States daughter will transport him  home at [employer] and family is support system, states [facility] [address],  Pta self ambulatory.  Expected Discharge Plan: Home/Self Care Barriers to Discharge: Continued Medical Work up   Patient Goals and CMS Choice Patient states their goals for this hospitalization and ongoing recovery are:: return home   Choice offered to / list presented to : NA      Expected Discharge Plan and Services In-house Referral: NA Discharge Planning Services: CM Consult Post Acute Care Choice: NA Living arrangements for the past 2 months: Single Family Home                 DME Arranged: N/A DME Agency: NA       HH Arranged: NA          Prior Living Arrangements/Services Living arrangements for the past 2 months: Single Family Home Lives with:: Self Patient language and need for interpreter reviewed:: Yes Do you feel safe going back to the place where you live?: Yes      Need for Family Participation in Patient Care: Yes (Comment) Care giver support system in place?: Yes (comment)   Criminal Activity/Legal Involvement Pertinent to Current Situation/Hospitalization: No - Comment as needed  Activities of Daily Living Home Assistive Devices/Equipment: None ADL Screening (condition at time of admission) Patient's cognitive ability adequate to safely complete daily activities?: No Is the patient deaf or have difficulty hearing?: No Does the patient have difficulty seeing, even when wearing glasses/contacts?: No Does the patient have difficulty  concentrating, remembering, or making decisions?: No Patient able to express need for assistance with ADLs?: Yes Does the patient have difficulty dressing or bathing?: No Independently performs ADLs?: Yes (appropriate for developmental age) Does the patient have difficulty walking or climbing stairs?: No Weakness of Legs: None Weakness of Arms/Hands: None  Permission Sought/Granted Permission sought to share information with : Case Manager Permission granted to share information with : Yes, Verbal Permission Granted              Emotional Assessment   Attitude/Demeanor/Rapport: Engaged Affect (typically observed): Appropriate Orientation: : Oriented to Self, Oriented to Place, Oriented to  Time, Oriented to Situation   Psych Involvement: No (comment)  Admission diagnosis:  Dizziness [R42] Bradycardia [R00.1] Weakness [R53.1] 2nd degree AV block [I44.1] Patient Active Problem List   Diagnosis Date Noted   Dizziness 02/25/2023   Hypertension    COVID-19 virus infection 07/2020   Incarcerated right inguinal hernia 12/31/2018   Chest pain on exertion 08/19/2015   Shortness of breath 08/19/2015   PCP:  [name], MD Pharmacy:   [phone] [place] [address], [place] - [address] [facility] [address] [place] [address] Phone: [phone] Fax: [phone]     Social Determinants of Health (SDOH) Social History: SDOH Screenings   Food Insecurity: No Food Insecurity (02/25/2023)  Housing: Low Risk  (02/25/2023)  Transportation Needs: No Transportation Needs (02/25/2023)  Utilities: Not At Risk (02/25/2023)  Tobacco Use: Medium Risk (02/25/2023)   SDOH Interventions:     Readmission Risk Interventions     No data to display

## 2023-07-22 NOTE — Progress Notes (Addendum)
 Subjective Patient ID: Austin Harrington is a 73 y.o. male.  Patient is 72 year old male who presents with 3 week history of right knee pain - denies injury, reports had been taking aleve but it is no longer working.  Reports increase in swelling to the knee.  No history of gout, RA, OA.   History provided by:  Patient Language interpreter used: No   Knee Pain     Review of Systems  Constitutional:  Negative for chills, fatigue and fever.  HENT:  Negative for congestion and sore throat.   Eyes:  Negative for visual disturbance.  Respiratory:  Negative for cough and shortness of breath.   Cardiovascular:  Negative for chest pain.  Gastrointestinal:  Negative for abdominal pain, diarrhea, nausea and vomiting.  Musculoskeletal:  Positive for arthralgias and joint swelling. Negative for myalgias.  Skin:  Negative for rash and wound.  Neurological:  Negative for dizziness and headaches.    Patient History  Allergies: No Known Allergies   Past Medical History:  Diagnosis Date  . Hypertension    History reviewed. No pertinent surgical history. Social History   Socioeconomic History  . Marital status: Not on file    Spouse name: Not on file  . Number of children: Not on file  . Years of education: Not on file  . Highest education level: Not on file  Occupational History  . Not on file  Tobacco Use  . Smoking status: Never  . Smokeless tobacco: Never  Substance and Sexual Activity  . Alcohol  use: Not Currently  . Drug use: Not on file  . Sexual activity: Not on file  Other Topics Concern  . Not on file  Social History Narrative  . Not on file   History reviewed. No pertinent family history. Current Outpatient Medications on File Prior to Visit  Medication Sig Dispense Refill  . atorvastatin  (Lipitor) 40 MG tablet TAKE 1 TABLET BY MOUTH EVERY DAY AT 6 PM    . lisinopril 5 MG tablet Take 5 mg by mouth 1 (one) time each day.     No current facility-administered  medications on file prior to visit.     Objective  Vitals:   07/22/23 1442  BP: (!) 147/54  BP Location: Left arm  Patient Position: Sitting  BP Cuff Size: Adult  Pulse: (!) 54  Resp: 18  Temp: 36.7 C (98 F)  TempSrc: Oral  SpO2: 97%  Weight: 90.4 kg  Height: 5' 9  PainSc:   6                No results found.  Physical Exam Vitals and nursing note reviewed.  Constitutional:      General: He is not in acute distress.    Appearance: Normal appearance. He is not ill-appearing.  HENT:     Head: Normocephalic and atraumatic.     Right Ear: Tympanic membrane, ear canal and external ear normal.     Left Ear: Tympanic membrane, ear canal and external ear normal.     Nose: Nose normal.     Mouth/Throat:     Mouth: Mucous membranes are moist.  Eyes:     Conjunctiva/sclera: Conjunctivae normal.  Cardiovascular:     Rate and Rhythm: Normal rate and regular rhythm.     Heart sounds: Normal heart sounds. No murmur heard.    No friction rub.  Pulmonary:     Effort: Pulmonary effort is normal. No respiratory distress.     Breath  sounds: Normal breath sounds. No stridor. No wheezing or rhonchi.  Abdominal:     General: Abdomen is flat.     Palpations: Abdomen is soft. There is no mass.     Tenderness: There is no abdominal tenderness.  Musculoskeletal:        General: Swelling and tenderness present. No signs of injury.     Comments: Decrease ROM with flexion and full extension, large knee effusion, knee stable to anterior/posterior drawer signs and varus and valgus stressing.  Lymphadenopathy:     Cervical: No cervical adenopathy.  Skin:    General: Skin is warm and dry.     Findings: No rash.  Neurological:     General: No focal deficit present.     Mental Status: He is alert.  Psychiatric:        Mood and Affect: Mood normal.        Behavior: Behavior normal.      Results for orders placed or performed in visit on 07/22/23  XR knee 2 views right    Narrative   Knee: There is mild degenerative osteoarthritis in the medial compartment with narrowing of the joint space. Small osteophytes are also present. There is no evidence for acute fracture or periosteal reaction. Alignment is anatomic.  There is no loose body identified. No joint effusion.     Impression   Mild degenerative disease.  Electronically signed by: Honora Dempsey Mo, M.D. on 07/22/2023 15:53:30     Procedures MDM:     1 Acute, uncomplicated illness or injury     1 Self-limited or minor problem     Explanation of Medical Decision Making and variances from expected care:  73 year old male with right knee pain and effusion on physical exam, x-ray with mild osteoarthritis, placed in knee immobilizer and ortho follow up.    Unique ordered tests: One     Assessment requiring historian other than patient: No     Independent visualization of image, tracing, or test: No     Discussion of management with another provider: No     Risk:: Moderate          Assessment/Plan Diagnoses and all orders for this visit:  Effusion of right knee joint -     XR knee 2 views right -     meloxicam  (Mobic ) 7.5 MG tablet; Take 1 tablet (7.5 mg total) by mouth 1 (one) time each day. -     KNEE SUPPORT SLEEVE, NEOPRENE      Disposition Status: Home  Progress note signed by Cathlean Gasman, PA on 07/23/23 at  8:12 AM

## 2023-07-22 NOTE — Progress Notes (Signed)
 Austin Harrington is a 73 y.o. male is here for knee pain. Onset 3 weeks ago no injury done to knee as pt can recall. Pt has took some aleve and it worked for a while.

## 2024-03-31 ENCOUNTER — Encounter (HOSPITAL_COMMUNITY): Payer: Self-pay

## 2024-03-31 ENCOUNTER — Other Ambulatory Visit: Payer: Self-pay

## 2024-03-31 ENCOUNTER — Inpatient Hospital Stay (HOSPITAL_COMMUNITY)
Admission: EM | Admit: 2024-03-31 | Discharge: 2024-04-02 | DRG: 243 | Disposition: A | Discharge status: Home / Self Care | Attending: Cardiovascular Disease | Admitting: Cardiovascular Disease

## 2024-03-31 ENCOUNTER — Emergency Department (HOSPITAL_COMMUNITY)

## 2024-03-31 DIAGNOSIS — Z79899 Other long term (current) drug therapy: Secondary | ICD-10-CM

## 2024-03-31 DIAGNOSIS — I110 Hypertensive heart disease with heart failure: Secondary | ICD-10-CM | POA: Diagnosis present

## 2024-03-31 DIAGNOSIS — I5020 Unspecified systolic (congestive) heart failure: Secondary | ICD-10-CM | POA: Diagnosis present

## 2024-03-31 DIAGNOSIS — R531 Weakness: Secondary | ICD-10-CM | POA: Diagnosis present

## 2024-03-31 DIAGNOSIS — G610 Guillain-Barre syndrome: Secondary | ICD-10-CM | POA: Diagnosis present

## 2024-03-31 DIAGNOSIS — Z8616 Personal history of COVID-19: Secondary | ICD-10-CM

## 2024-03-31 DIAGNOSIS — F5101 Primary insomnia: Secondary | ICD-10-CM | POA: Diagnosis present

## 2024-03-31 DIAGNOSIS — R001 Bradycardia, unspecified: Secondary | ICD-10-CM | POA: Diagnosis present

## 2024-03-31 DIAGNOSIS — I10 Essential (primary) hypertension: Secondary | ICD-10-CM | POA: Diagnosis present

## 2024-03-31 DIAGNOSIS — Z8781 Personal history of (healed) traumatic fracture: Secondary | ICD-10-CM

## 2024-03-31 DIAGNOSIS — I442 Atrioventricular block, complete: Secondary | ICD-10-CM | POA: Diagnosis not present

## 2024-03-31 DIAGNOSIS — M159 Polyosteoarthritis, unspecified: Secondary | ICD-10-CM | POA: Diagnosis present

## 2024-03-31 DIAGNOSIS — Z87891 Personal history of nicotine dependence: Secondary | ICD-10-CM

## 2024-03-31 LAB — BASIC METABOLIC PANEL WITH GFR
Anion gap: 12 (ref 5–15)
BUN: 15 mg/dL (ref 8–23)
CO2: 23 mmol/L (ref 22–32)
Calcium: 9.4 mg/dL (ref 8.9–10.3)
Chloride: 104 mmol/L (ref 98–111)
Creatinine, Ser: 0.94 mg/dL (ref 0.61–1.24)
GFR, Estimated: >60 mL/min (ref >60)
Glucose, Bld: 88 mg/dL (ref 70–99)
Potassium: 4.6 mmol/L (ref 3.5–5.1)
Sodium: 139 mmol/L (ref 135–145)

## 2024-03-31 LAB — CBC WITH DIFFERENTIAL/PLATELET
Abs Immature Granulocytes: 0.01 K/uL (ref 0.00–0.07)
Basophils Absolute: 0 K/uL (ref 0.0–0.1)
Basophils Relative: 0 %
Eosinophils Absolute: 0.1 K/uL (ref 0.0–0.5)
Eosinophils Relative: 2 %
HCT: 39.9 % (ref 39.0–52.0)
Hemoglobin: 13.2 g/dL (ref 13.0–17.0)
Immature Granulocytes: 0 %
Lymphocytes Relative: 23 %
Lymphs Abs: 1.6 K/uL (ref 0.7–4.0)
MCH: 31.1 pg (ref 26.0–34.0)
MCHC: 33.1 g/dL (ref 30.0–36.0)
MCV: 94.1 fL (ref 80.0–100.0)
Monocytes Absolute: 0.6 K/uL (ref 0.1–1.0)
Monocytes Relative: 9 %
Neutro Abs: 4.5 K/uL (ref 1.7–7.7)
Neutrophils Relative %: 66 %
Platelets: 196 K/uL (ref 150–400)
RBC: 4.24 MIL/uL (ref 4.22–5.81)
RDW: 11.9 % (ref 11.5–15.5)
WBC: 6.7 K/uL (ref 4.0–10.5)
nRBC: 0 % (ref 0.0–0.2)

## 2024-03-31 LAB — TROPONIN I (HIGH SENSITIVITY)
Troponin I (High Sensitivity): 12 ng/L (ref <18)
Troponin I (High Sensitivity): 11 ng/L (ref <18)

## 2024-03-31 LAB — MAGNESIUM: Magnesium: 1.9 mg/dL (ref 1.7–2.4)

## 2024-03-31 LAB — TSH: TSH: 2.535 u[IU]/mL (ref 0.350–4.500)

## 2024-03-31 MED ORDER — VITAMIN C 500 MG PO TABS
250.0000 mg | ORAL_TABLET | Freq: Every day | ORAL | Status: DC
Start: 2024-04-01 — End: 2024-04-02
  Administered 2024-04-01 – 2024-04-02 (×2): 250 mg via ORAL
  Filled 2024-03-31 (×2): qty 1

## 2024-03-31 MED ORDER — ATORVASTATIN CALCIUM 10 MG PO TABS
20.0000 mg | ORAL_TABLET | Freq: Every evening | ORAL | Status: DC
  Administered 2024-04-01 – 2024-04-02 (×2): 20 mg via ORAL
  Filled 2024-03-31 (×2): qty 2

## 2024-03-31 MED ORDER — ADULT MULTIVITAMIN W/MINERALS CH
1.0000 | ORAL_TABLET | Freq: Every day | ORAL | Status: DC
Start: 2024-03-31 — End: 2024-04-02
  Administered 2024-03-31 – 2024-04-02 (×3): 1 via ORAL
  Filled 2024-03-31 (×3): qty 1

## 2024-03-31 MED ORDER — POLYETHYLENE GLYCOL 3350 17 G PO PACK: 17.0000 g | PACK | Freq: Every day | ORAL | Status: DC | PRN

## 2024-03-31 MED ORDER — ONDANSETRON HCL 4 MG/2ML IJ SOLN: 4.0000 mg | Freq: Four times a day (QID) | INTRAMUSCULAR | Status: DC | PRN

## 2024-03-31 MED ORDER — HYDRALAZINE HCL 10 MG PO TABS
10.0000 mg | ORAL_TABLET | Freq: Two times a day (BID) | ORAL | Status: DC
  Administered 2024-04-01 – 2024-04-02 (×2): 10 mg via ORAL
  Filled 2024-03-31 (×3): qty 1

## 2024-03-31 MED ORDER — SODIUM CHLORIDE 0.9 % IV SOLN: INTRAVENOUS | Status: AC

## 2024-03-31 MED ORDER — ACETAMINOPHEN 650 MG RE SUPP: 650.0000 mg | Freq: Four times a day (QID) | RECTAL | Status: DC | PRN

## 2024-03-31 MED ORDER — FERROUS SULFATE 325 (65 FE) MG PO TABS
325.0000 mg | ORAL_TABLET | Freq: Every day | ORAL | Status: DC
  Administered 2024-04-01 – 2024-04-02 (×2): 325 mg via ORAL
  Filled 2024-03-31 (×2): qty 1

## 2024-03-31 MED ORDER — ONDANSETRON HCL 4 MG PO TABS: 4.0000 mg | ORAL_TABLET | Freq: Four times a day (QID) | ORAL | Status: DC | PRN

## 2024-03-31 MED ORDER — HEPARIN SODIUM (PORCINE) 5000 UNIT/ML IJ SOLN
5000.0000 [IU] | Freq: Three times a day (TID) | INTRAMUSCULAR | Status: DC
  Administered 2024-03-31 – 2024-04-02 (×4): 5000 [IU] via SUBCUTANEOUS
  Filled 2024-03-31 (×5): qty 1

## 2024-03-31 MED ORDER — ACETAMINOPHEN 325 MG PO TABS
650.0000 mg | ORAL_TABLET | Freq: Four times a day (QID) | ORAL | Status: DC | PRN
  Administered 2024-04-01: 650 mg via ORAL
  Filled 2024-03-31: qty 2

## 2024-03-31 NOTE — ED Triage Notes (Signed)
 Pt went to PCP for check up and PCP stated pt had abnormal EKG. Denies CP and C/O of minimal SHOB. Axox4.

## 2024-03-31 NOTE — ED Provider Triage Note (Signed)
 Emergency Medicine Provider Triage Evaluation Note  Austin Harrington , a 73 y.o. male  was evaluated in triage.  Pt complains of irregular EKG.  Previous EKGs showed second-degree type I heart block.  Patient went to [facility] today for routine checkup.  States that he had irregular EKG.  Sent for further evaluation.  Patient denies chest pain, shortness of breath, lightheadedness, syncope.  Review of Systems  Positive:  Negative: Chest pain, shortness of breath, lightheadedness  Physical Exam  Ht 5' 9 (1.753 m)   Wt 83.9 kg   BMI 27.32 kg/m  Gen:   Awake, no distress   Resp:  Normal effort  MSK:   Moves extremities without difficulty  Other:  Bradycardia  Medical Decision Making  Medically screening exam initiated at 5:08 PM.  Appropriate orders placed.  Austin Harrington was informed that the remainder of the evaluation will be completed by another provider, this initial triage assessment does not replace that evaluation, and the importance of remaining in the ED until their evaluation is complete.  On EKG patient has with him, shows block, question 3rd degree, EKG performed here appears more like [place] I.  Patient looks very well, tolerating well.  Labs ordered.   Austin Chew, PA-C 03/31/24 1710

## 2024-03-31 NOTE — ED Provider Notes (Addendum)
 [place] EMERGENCY DEPARTMENT AT [facility] Provider Note   CSN: [id number] Arrival date & time: 03/31/24  1657     Patient presents with: Irregular EKG   Austin Harrington is a 73 y.o. male.   HPI  Patient is a 74 year old male with past medical history significant for hypertension arthritis  Patient presents emergency room today with complaints of abnormal EKG denies chest pain has had some exertional shortness of breath that is mild that occurred today.  Was found to have complete heart block on PCP EKG.  Has a history of secondary type I.  Denies any syncope or near syncope.      Prior to Admission medications   Medication Sig Start Date End Date Taking? Authorizing Provider  ascorbic acid  (VITAMIN C ) 250 MG tablet Take 1 tablet (250 mg total) by mouth daily with breakfast. 02/27/23   Claudene Pacific, MD  atorvastatin  (LIPITOR) 20 MG tablet Take 1 tablet (20 mg total) by mouth every evening. 02/26/23   Claudene Pacific, MD  ferrous sulfate  325 (65 FE) MG tablet Take 1 tablet (325 mg total) by mouth daily with breakfast. 02/27/23   Claudene Pacific, MD  hydrALAZINE  (APRESOLINE ) 10 MG tablet Take 1 tablet (10 mg total) by mouth 2 (two) times daily. 02/26/23   Claudene Pacific, MD    Allergies: Patient has no known allergies.    Review of Systems  Updated Vital Signs BP (!) 165/63 (BP Location: Right Arm)   Pulse (!) 38   Temp 97.8 F (36.6 C) (Oral)   Resp (!) 21   Ht 5' 9 (1.753 m)   Wt 83.9 kg   SpO2 100%   BMI 27.32 kg/m   Physical Exam Vitals and nursing note reviewed.  Constitutional:      General: He is not in acute distress.    Comments: Pleasant well-appearing 73 year old.  In no acute distress.  Sitting comfortably in bed.  Able answer questions appropriately follow commands. No increased work of breathing. Speaking in full sentences.   HENT:     Head: Normocephalic and atraumatic.     Nose: Nose normal.  Eyes:     General: No scleral  icterus. Cardiovascular:     Rate and Rhythm: Regular rhythm. Bradycardia present.     Pulses: Normal pulses.     Heart sounds: Normal heart sounds.  Pulmonary:     Effort: Pulmonary effort is normal. No respiratory distress.     Breath sounds: No wheezing.  Abdominal:     Palpations: Abdomen is soft.     Tenderness: There is no abdominal tenderness.  Musculoskeletal:     Cervical back: Normal range of motion.     Right lower leg: No edema.     Left lower leg: No edema.  Skin:    General: Skin is warm and dry.     Capillary Refill: Capillary refill takes less than 2 seconds.  Neurological:     Mental Status: He is alert. Mental status is at baseline.  Psychiatric:        Mood and Affect: Mood normal.        Behavior: Behavior normal.     (all labs ordered are listed, but only abnormal results are displayed) Labs Reviewed  CBC WITH DIFFERENTIAL/PLATELET  BASIC METABOLIC PANEL WITH GFR  MAGNESIUM  TROPONIN I (HIGH SENSITIVITY)    EKG: None  Radiology: DG Chest Portable 1 View Result Date: 03/31/2024 CLINICAL DATA:  Heart block. EXAM: PORTABLE CHEST 1 VIEW COMPARISON:  Chest radiograph dated 02/25/2023. FINDINGS: Mild cardiomegaly with mild central vascular congestion. No focal consolidation, pleural effusion, pneumothorax. No acute osseous pathology. IMPRESSION: Mild cardiomegaly with mild central vascular congestion. Electronically Signed   By: Vanetta Chou M.D.   On: 03/31/2024 17:31     .Critical Care  Performed by: Neldon Hamp RAMAN, PA Authorized by: Neldon Hamp RAMAN, PA   Critical care provider statement:    Critical care time (minutes):  35   Critical care time was exclusive of:  Separately billable procedures and treating other patients and teaching time   Critical care was necessary to treat or prevent imminent or life-threatening deterioration of the following conditions:  Circulatory failure and cardiac failure   Critical care was time spent personally  by me on the following activities:  Development of treatment plan with patient or surrogate, review of old charts, re-evaluation of patient's condition, pulse oximetry, ordering and review of radiographic studies, ordering and review of laboratory studies, ordering and performing treatments and interventions, obtaining history from patient or surrogate, examination of patient and evaluation of patient's response to treatment   Care discussed with: admitting provider      Medications Ordered in the ED - No data to display                                  Medical Decision Making Risk Decision regarding hospitalization.   This patient presents to the ED for concern of SOB, this involves a number of treatment options, and is a complaint that carries with it a moderate to high risk of complications and morbidity. A differential diagnosis was considered for the patient's symptoms which is discussed below:   The causes for shortness of breath include but are not limited to Cardiac (AHF, pericardial effusion and tamponade, arrhythmias, ischemia, etc) Respiratory (COPD, asthma, pneumonia, pneumothorax, primary pulmonary hypertension, PE/VQ mismatch) Hematological (anemia) Neuromuscular (ALS, Guillain-Barr, etc)    Co morbidities: Discussed in HPI   Brief History:  Patient is a 73 year old male with past medical history significant for hypertension arthritis  Patient presents emergency room today with complaints of abnormal EKG denies chest pain has had some exertional shortness of breath that is mild that occurred today.  Was found to have complete heart block on PCP EKG.  Has a history of secondary type I.  Denies any syncope or near syncope.    EMR reviewed including pt PMHx, past surgical history and past visits to ER.   See HPI for more details   Lab Tests:   I ordered and independently interpreted labs. Labs notable for Mag BMP CBC troponin all normal  Imaging  Studies:  Chest x-ray with mild vascular congestion and cardiomegaly    Cardiac Monitoring:  The patient was maintained on a cardiac monitor.  I personally viewed and interpreted the cardiac monitored which showed an underlying rhythm of: 38 Heart Block bradycardia EKG non-ischemic   Medicines ordered:    Critical Interventions:   discussion with cardiology admission for Cath Lab visit and pacemaker   Consults/Attending Physician   I requested consultation with Dr. Claudene,  and discussed lab and imaging findings as well as pertinent plan - they recommend: Admission   Reevaluation:  After the interventions noted above I re-evaluated patient and found that they have :stayed the same   Social Determinants of Health:      Problem List / ED Course:  Patient with third-degree heart  block not on any new medications not on any AV nodal blockers.  Has some exertional shortness of breath.  No chest pain.  Pacer pads placed on patient's chest.  Dr. Claudene will admit   Dispostion:  After consideration of the diagnostic results and the patients response to treatment, I feel that the patent would benefit from admission   Final diagnoses:  Complete heart block [facility])    ED Discharge Orders     None          Neldon Hamp RAMAN, [place] 03/31/24 2103    Neldon Hamp RAMAN, [place] 03/31/24 2104    Tegeler, Lonni PARAS, MD 03/31/24 2322

## 2024-03-31 NOTE — ED Notes (Signed)
 CCMD called.

## 2024-03-31 NOTE — H&P (Signed)
 Referring Physician: Lonni Sakai, MD/Wylder GORMAN Bow, PA  Austin Harrington is an 73 y.o. male.                       Chief Complaint: Weakness and abnormal EKG  HPI: 73 years old black male with PMH of 2nd degree AV block type 1, HTN, Arthritis has recurrent episodes of weakness. He denies chest pain or palpitation.  5 years ago he started with 1st degree AV block, 3 years ago he had 2nd degree AV block and now he appears to be in advanced 2nd degree AV block v/s 3rd degree AV block. He is not on any beta blocker or calcium  channel AV blocking medications. His thyroid  function was normal last year. Today's TSH is pending. His heart rate is mostly in 30's without hypotension.  His cardiac catheterization was normal 8 years ago.  Past Medical History:  Diagnosis Date   Arthritis    COVID-19 virus infection 07/2020   History of vertebral fracture    Hypertension       Past Surgical History:  Procedure Laterality Date   BACK SURGERY     CARDIAC CATHETERIZATION N/A 08/19/2015   Procedure: Left Heart Cath and Coronary Angiography;  Surgeon: Salena Negri, MD;  Location: [facility] INVASIVE CV LAB;  Service: Cardiovascular;  Laterality: N/A;   INGUINAL HERNIA REPAIR  12/31/2018   INGUINAL HERNIA REPAIR Right 12/31/2018   Procedure: RIGHT HERNIA REPAIR INGUINAL ADULT;  Surgeon: Belinda Cough, MD;  Location: [facility] OR;  Service: General;  Laterality: Right;  GENERAL AND TAP BLOCK ANESTHESIA   INSERTION OF MESH Right 12/31/2018   Procedure: INSERTION OF MESH;  Surgeon: Belinda Cough, MD;  Location: [facility] OR;  Service: General;  Laterality: Right;    History reviewed. No pertinent family history. Social History:  reports that he quit smoking about 40 years ago. He has quit using smokeless tobacco. He reports that he does not drink alcohol  and does not use drugs.  Allergies: No Known Allergies  (Not in a hospital admission)   Results for orders placed or performed during the hospital encounter of  03/31/24 (from the past 48 hours)  CBC with Differential     Status: None   Collection Time: 03/31/24  5:07 PM  Result Value Ref Range   WBC 6.7 4.0 - 10.5 K/uL   RBC 4.24 4.22 - 5.81 MIL/uL   Hemoglobin 13.2 13.0 - 17.0 g/dL   HCT 60.0 60.9 - 47.9 %   MCV 94.1 80.0 - 100.0 fL   MCH 31.1 26.0 - 34.0 pg   MCHC 33.1 30.0 - 36.0 g/dL   RDW 88.0 88.4 - 84.4 %   Platelets 196 150 - 400 K/uL   nRBC 0.0 0.0 - 0.2 %   Neutrophils Relative % 66 %   Neutro Abs 4.5 1.7 - 7.7 K/uL   Lymphocytes Relative 23 %   Lymphs Abs 1.6 0.7 - 4.0 K/uL   Monocytes Relative 9 %   Monocytes Absolute 0.6 0.1 - 1.0 K/uL   Eosinophils Relative 2 %   Eosinophils Absolute 0.1 0.0 - 0.5 K/uL   Basophils Relative 0 %   Basophils Absolute 0.0 0.0 - 0.1 K/uL   Immature Granulocytes 0 %   Abs Immature Granulocytes 0.01 0.00 - 0.07 K/uL    Comment: Performed at [facility] Lab, [address] [place], [place] [address]  Basic metabolic panel     Status: None   Collection Time: 03/31/24  5:07 PM  Result Value Ref Range   Sodium 139 135 - 145 mmol/L   Potassium 4.6 3.5 - 5.1 mmol/L   Chloride 104 98 - 111 mmol/L   CO2 23 22 - 32 mmol/L   Glucose, Bld 88 70 - 99 mg/dL    Comment: Glucose reference range applies only to samples taken after fasting for at least 8 hours.   BUN 15 8 - 23 mg/dL   Creatinine, Ser 9.05 0.61 - 1.24 mg/dL   Calcium  9.4 8.9 - 10.3 mg/dL   GFR, Estimated >39 >39 mL/min    Comment: (NOTE) Calculated using the CKD-EPI Creatinine Equation (2021)    Anion gap 12 5 - 15    Comment: Performed at [facility], [address] [place], [place] [address]  Magnesium     Status: None   Collection Time: 03/31/24  5:07 PM  Result Value Ref Range   Magnesium 1.9 1.7 - 2.4 mg/dL    Comment: Performed at [facility], [address], [place], [place] [address]  Troponin I (High Sensitivity)     Status: None   Collection Time: 03/31/24  5:07 PM  Result Value Ref Range   Troponin I  (High Sensitivity) 12 <18 ng/L    Comment: (NOTE) Elevated high sensitivity troponin I (hsTnI) values and significant  changes across serial measurements may suggest ACS but many other  chronic and acute conditions are known to elevate hsTnI results.  Refer to the Links section for chest pain algorithms and additional  guidance. Performed at [facility], [address], [place], [place] [address]    DG Chest Portable 1 View Result Date: 03/31/2024 CLINICAL DATA:  Heart block. EXAM: PORTABLE CHEST 1 VIEW COMPARISON:  Chest radiograph dated 02/25/2023. FINDINGS: Mild cardiomegaly with mild central vascular congestion. No focal consolidation, pleural effusion, pneumothorax. No acute osseous pathology. IMPRESSION: Mild cardiomegaly with mild central vascular congestion. Electronically Signed   By: Vanetta Chou M.D.   On: 03/31/2024 17:31    Review Of Systems Constitutional: No fever, chills, weight loss or gain. Eyes: No vision change, wears glasses. No discharge or pain. Ears: No hearing loss, No tinnitus. Respiratory: No asthma, COPD, pneumonias. Positive shortness of breath. No hemoptysis. Cardiovascular: No chest pain, palpitation, leg edema. Gastrointestinal: No nausea, vomiting, diarrhea, constipation. No GI bleed. No hepatitis. Genitourinary: No dysuria, hematuria, kidney stone. No incontinance. Neurological: No headache, stroke, seizures.  Psychiatry: No psych facility admission for anxiety, depression, suicide. No detox. Skin: No rash. Musculoskeletal: Positive joint pain, right knee and ankle more than left knee or ankle, no fibromyalgia. No neck pain, positive back pain. Lymphadenopathy: No lymphadenopathy. Hematology: H/O anemia, no easy bruising.   Blood pressure (!) 145/53, pulse (!) 36, temperature 97.8 F (36.6 C), temperature source Oral, resp. rate 16, height 5' 9 (1.753 m), weight 83.9 kg, SpO2 100%. Body mass index is 27.32 kg/m. General appearance:  alert, cooperative, appears stated age and no distress Head: Normocephalic, atraumatic. Eyes: Brown eyes, pink conjunctiva, corneas clear.  Neck: No adenopathy, no carotid bruit, no JVD, supple, symmetrical, trachea midline and thyroid  not enlarged. Resp: Clear to auscultation bilaterally. Cardio: Slow and irregular rate and rhythm, S1, S2 normal, II/VI systolic murmur, no click, rub or gallop GI: Soft, non-tender; bowel sounds normal; no organomegaly. Extremities: No edema, cyanosis or clubbing. Skin: Warm and dry.  Neurologic: Alert and oriented X 3, normal strength. Normal coordination.  Assessment/Plan Weakness Complete heart block HTN Arthritis Primary  insomnia  Plan: Check TSH EP consult for high grade AV block, pacemaker placement. Check echocardiogram for LV function.  Time spent: Review of old records, Lab, x-rays, EKG, other cardiac tests, examination, discussion with patient/Doctor/Nurse over 70 minutes.  Salena GORMAN Negri, MD  03/31/2024, 7:01 PM

## 2024-03-31 NOTE — ED Notes (Signed)
 Zoll pads attached to patient and monitoring

## 2024-03-31 NOTE — ED Notes (Signed)
 CCMD called for cardiac monitoring.

## 2024-04-01 ENCOUNTER — Observation Stay (HOSPITAL_COMMUNITY)

## 2024-04-01 ENCOUNTER — Encounter (HOSPITAL_COMMUNITY): Payer: Self-pay | Admitting: Cardiovascular Disease

## 2024-04-01 ENCOUNTER — Encounter (HOSPITAL_COMMUNITY)
Admission: EM | Disposition: A | Discharge status: Home / Self Care | Payer: Self-pay | Source: Home / Self Care | Attending: Cardiovascular Disease

## 2024-04-01 DIAGNOSIS — I442 Atrioventricular block, complete: Secondary | ICD-10-CM

## 2024-04-01 DIAGNOSIS — I110 Hypertensive heart disease with heart failure: Secondary | ICD-10-CM | POA: Diagnosis present

## 2024-04-01 DIAGNOSIS — M159 Polyosteoarthritis, unspecified: Secondary | ICD-10-CM | POA: Diagnosis present

## 2024-04-01 DIAGNOSIS — Z79899 Other long term (current) drug therapy: Secondary | ICD-10-CM | POA: Diagnosis not present

## 2024-04-01 DIAGNOSIS — R001 Bradycardia, unspecified: Secondary | ICD-10-CM | POA: Diagnosis present

## 2024-04-01 DIAGNOSIS — Z8781 Personal history of (healed) traumatic fracture: Secondary | ICD-10-CM | POA: Diagnosis not present

## 2024-04-01 DIAGNOSIS — R531 Weakness: Secondary | ICD-10-CM | POA: Diagnosis present

## 2024-04-01 DIAGNOSIS — I5020 Unspecified systolic (congestive) heart failure: Secondary | ICD-10-CM | POA: Diagnosis present

## 2024-04-01 DIAGNOSIS — F5101 Primary insomnia: Secondary | ICD-10-CM | POA: Diagnosis present

## 2024-04-01 DIAGNOSIS — Z8616 Personal history of COVID-19: Secondary | ICD-10-CM | POA: Diagnosis not present

## 2024-04-01 DIAGNOSIS — Z87891 Personal history of nicotine dependence: Secondary | ICD-10-CM | POA: Diagnosis not present

## 2024-04-01 HISTORY — PX: BIV PACEMAKER INSERTION CRT-P: EP1199

## 2024-04-01 LAB — ECHOCARDIOGRAM COMPLETE
AR max vel: 2.73 cm2
AV Area VTI: 2.66 cm2
AV Area mean vel: 2.46 cm2
AV Mean grad: 3 mmHg
AV Peak grad: 6.1 mmHg
Ao pk vel: 1.23 m/s
Height: 69 in
MV M vel: 4.91 m/s
MV Peak grad: 96.4 mmHg
MV VTI: 2.62 cm2
S' Lateral: 3.9 cm
Single Plane A4C EF: 50.5 %
Weight: 2994.73 [oz_av]

## 2024-04-01 LAB — CBC
HCT: 35.3 % — ABNORMAL LOW (ref 39.0–52.0)
HCT: 35.9 % — ABNORMAL LOW (ref 39.0–52.0)
Hemoglobin: 11.9 g/dL — ABNORMAL LOW (ref 13.0–17.0)
Hemoglobin: 12 g/dL — ABNORMAL LOW (ref 13.0–17.0)
MCH: 30.5 pg (ref 26.0–34.0)
MCH: 30.9 pg (ref 26.0–34.0)
MCHC: 33.4 g/dL (ref 30.0–36.0)
MCHC: 33.7 g/dL (ref 30.0–36.0)
MCV: 91.3 fL (ref 80.0–100.0)
MCV: 91.7 fL (ref 80.0–100.0)
Platelets: 165 K/uL (ref 150–400)
Platelets: 170 K/uL (ref 150–400)
RBC: 3.85 MIL/uL — ABNORMAL LOW (ref 4.22–5.81)
RBC: 3.93 MIL/uL — ABNORMAL LOW (ref 4.22–5.81)
RDW: 11.9 % (ref 11.5–15.5)
RDW: 11.9 % (ref 11.5–15.5)
WBC: 4.7 K/uL (ref 4.0–10.5)
WBC: 5 K/uL (ref 4.0–10.5)
nRBC: 0 % (ref 0.0–0.2)
nRBC: 0 % (ref 0.0–0.2)

## 2024-04-01 LAB — BASIC METABOLIC PANEL WITH GFR
Anion gap: 8 (ref 5–15)
BUN: 13 mg/dL (ref 8–23)
CO2: 23 mmol/L (ref 22–32)
Calcium: 8.9 mg/dL (ref 8.9–10.3)
Chloride: 108 mmol/L (ref 98–111)
Creatinine, Ser: 0.99 mg/dL (ref 0.61–1.24)
GFR, Estimated: >60 mL/min (ref >60)
Glucose, Bld: 94 mg/dL (ref 70–99)
Potassium: 4.1 mmol/L (ref 3.5–5.1)
Sodium: 139 mmol/L (ref 135–145)

## 2024-04-01 SURGERY — BIV PACEMAKER INSERTION CRT-P

## 2024-04-01 MED ORDER — CEFAZOLIN SODIUM-DEXTROSE 2-4 GM/100ML-% IV SOLN
INTRAVENOUS | Status: AC
  Filled 2024-04-01: qty 100

## 2024-04-01 MED ORDER — SODIUM CHLORIDE 0.9 % IV SOLN
80.0000 mg | INTRAVENOUS | Status: AC
  Administered 2024-04-01: 80 mg

## 2024-04-01 MED ORDER — SODIUM CHLORIDE 0.9% FLUSH
3.0000 mL | Freq: Two times a day (BID) | INTRAVENOUS | Status: DC
  Administered 2024-04-01 – 2024-04-02 (×3): 3 mL via INTRAVENOUS

## 2024-04-01 MED ORDER — HEPARIN (PORCINE) IN NACL 1000-0.9 UT/500ML-% IV SOLN
INTRAVENOUS | Status: DC | PRN
  Administered 2024-04-01: 500 mL

## 2024-04-01 MED ORDER — LIDOCAINE HCL (PF) 1 % IJ SOLN
INTRAMUSCULAR | Status: DC | PRN
  Administered 2024-04-01: 60 mL

## 2024-04-01 MED ORDER — SODIUM CHLORIDE 0.9% FLUSH: 3.0000 mL | INTRAVENOUS | Status: DC | PRN

## 2024-04-01 MED ORDER — FENTANYL CITRATE (PF) 100 MCG/2ML IJ SOLN
INTRAMUSCULAR | Status: DC | PRN
  Administered 2024-04-01: 50 ug via INTRAVENOUS

## 2024-04-01 MED ORDER — SODIUM CHLORIDE 0.9 % IV SOLN
INTRAVENOUS | Status: AC
  Filled 2024-04-01: qty 2

## 2024-04-01 MED ORDER — CHLORHEXIDINE GLUCONATE 4 % EX SOLN: 60.0000 mL | Freq: Once | CUTANEOUS | Status: DC

## 2024-04-01 MED ORDER — MIDAZOLAM HCL 5 MG/5ML IJ SOLN
INTRAMUSCULAR | Status: DC | PRN
  Administered 2024-04-01: 1 mg via INTRAVENOUS

## 2024-04-01 MED ORDER — LIDOCAINE HCL (PF) 1 % IJ SOLN
INTRAMUSCULAR | Status: AC
  Filled 2024-04-01: qty 30

## 2024-04-01 MED ORDER — CEFAZOLIN SODIUM-DEXTROSE 2-4 GM/100ML-% IV SOLN
2.0000 g | INTRAVENOUS | Status: AC
  Administered 2024-04-01: 2 g via INTRAVENOUS

## 2024-04-01 MED ORDER — SODIUM CHLORIDE 0.9 % IV SOLN
INTRAVENOUS | Status: DC
Start: 2024-04-01 — End: 2024-04-01

## 2024-04-01 MED ORDER — MIDAZOLAM HCL 5 MG/5ML IJ SOLN
INTRAMUSCULAR | Status: AC
  Filled 2024-04-01: qty 5

## 2024-04-01 MED ORDER — FENTANYL CITRATE (PF) 100 MCG/2ML IJ SOLN
INTRAMUSCULAR | Status: AC
  Filled 2024-04-01: qty 2

## 2024-04-01 MED ORDER — CEFAZOLIN SODIUM-DEXTROSE 1-4 GM/50ML-% IV SOLN
1.0000 g | Freq: Four times a day (QID) | INTRAVENOUS | Status: AC
  Administered 2024-04-01 – 2024-04-02 (×3): 1 g via INTRAVENOUS
  Filled 2024-04-01 (×3): qty 50

## 2024-04-01 MED ORDER — ACETAMINOPHEN 325 MG PO TABS: 325.0000 mg | ORAL_TABLET | ORAL | Status: DC | PRN

## 2024-04-01 SURGICAL SUPPLY — 13 items
CABLE SURGICAL S-101-97-12 (CABLE) ×2 IMPLANT
CATH RIGHTSITE C315HIS02 (CATHETERS) IMPLANT
IPG PACE AZUR XT DR MRI W1DR01 (Pacemaker) IMPLANT
KIT MICROPUNCTURE NIT STIFF (SHEATH) IMPLANT
LEAD CAPSURE NOVUS 5076-52CM (Lead) IMPLANT
LEAD SELECT SECURE 3830 383069 (Lead) IMPLANT
PAD DEFIB RADIO PHYSIO CONN (PAD) ×2 IMPLANT
SHEATH 7FR PRELUDE SNAP 13 (SHEATH) IMPLANT
SHEATH 9FR PRELUDE SNAP 13 (SHEATH) IMPLANT
SHEATH PROBE COVER 6X72 (BAG) IMPLANT
SLITTER 6232ADJ (MISCELLANEOUS) IMPLANT
TRAY PACEMAKER INSERTION (PACKS) ×2 IMPLANT
WIRE HI TORQ VERSACORE-J 145CM (WIRE) IMPLANT

## 2024-04-01 NOTE — Progress Notes (Signed)
 Ref: Claudene Pacific, MD   Subjective:  Awake. HR in 30-40s. BP 130/63. EP consulted for high grade AV block. Patient admits to decreased capacity to work than before as he gets tired easily He is made aware of low heart rate and possible pacemaker placement this admission..  Objective:  Vital Signs in the last 24 hours: Temp:  [97.7 F (36.5 C)-98.6 F (37 C)] 97.8 F (36.6 C) (09/30 0725) Pulse Rate:  [35-44] 39 (09/30 0725) Cardiac Rhythm: Heart block (09/30 0700) Resp:  [10-21] 19 (09/30 0725) BP: (108-165)/(53-63) 130/63 (09/30 0725) SpO2:  [97 %-100 %] 100 % (09/30 0725) Weight:  [83.9 kg-84.9 kg] 84.9 kg (09/30 0500)  Physical Exam: BP Readings from Last 1 Encounters:  04/01/24 130/63     Wt Readings from Last 1 Encounters:  04/01/24 84.9 kg    Weight change:  Body mass index is 27.64 kg/m. HEENT: [place]/AT, Eyes-Brown, Conjunctiva-Pink, Sclera-Non-icteric Neck: No JVD, No bruit, Trachea midline. Lungs:  Clear, Bilateral. Cardiac:  Bradycardic, Regular rhythm, normal S1 and S2, no S3. II/VI systolic murmur. Abdomen:  Soft, non-tender. BS present. Extremities:  No edema present. No cyanosis. No clubbing. CNS: AxOx3, Cranial nerves grossly intact, moves all 4 extremities.  Skin: Warm and dry.   Intake/Output from previous day: 09/29 0701 - 09/30 0700 In: 658.2 [P.O.:350; I.V.:308.2] Out: 1125 [Urine:1125]    Lab Results: BMET    Component Value Date/Time   NA 139 04/01/2024 0304   NA 139 03/31/2024 1707   NA 137 02/26/2023 0306   K 4.1 04/01/2024 0304   K 4.6 03/31/2024 1707   K 3.8 02/26/2023 0306   CL 108 04/01/2024 0304   CL 104 03/31/2024 1707   CL 105 02/26/2023 0306   CO2 23 04/01/2024 0304   CO2 23 03/31/2024 1707   CO2 24 02/26/2023 0306   GLUCOSE 94 04/01/2024 0304   GLUCOSE 88 03/31/2024 1707   GLUCOSE 91 02/26/2023 0306   BUN 13 04/01/2024 0304   BUN 15 03/31/2024 1707   BUN 13 02/26/2023 0306   CREATININE 0.99 04/01/2024 0304    CREATININE 0.94 03/31/2024 1707   CREATININE 0.99 02/26/2023 0306   CREATININE 1.19 01/14/2016 1659   CALCIUM  8.9 04/01/2024 0304   CALCIUM  9.4 03/31/2024 1707   CALCIUM  8.6 (L) 02/26/2023 0306   GFRNONAA >60 04/01/2024 0304   GFRNONAA >60 03/31/2024 1707   GFRNONAA >60 02/26/2023 0306   GFRAA >60 12/31/2018 1228   GFRAA >60 12/27/2018 1430   GFRAA >60 09/16/2018 1033   CBC    Component Value Date/Time   WBC 4.7 04/01/2024 0304   RBC 3.85 (L) 04/01/2024 0304   HGB 11.9 (L) 04/01/2024 0304   HCT 35.3 (L) 04/01/2024 0304   PLT 165 04/01/2024 0304   MCV 91.7 04/01/2024 0304   MCH 30.9 04/01/2024 0304   MCHC 33.7 04/01/2024 0304   RDW 11.9 04/01/2024 0304   LYMPHSABS 1.6 03/31/2024 1707   MONOABS 0.6 03/31/2024 1707   EOSABS 0.1 03/31/2024 1707   BASOSABS 0.0 03/31/2024 1707   HEPATIC Function Panel No results for input(s): PROT, ALBUMIN, AST, ALT, ALKPHOS, BILIDIR, IBILI in the last 8760 hours. HEMOGLOBIN A1C No results found for: MPG CARDIAC ENZYMES No results found for: CKTOTAL, CKMB, CKMBINDEX, TROPONINI BNP No results for input(s): PROBNP in the last 8760 hours. TSH Recent Labs    03/31/24 1852  TSH 2.535   CHOLESTEROL No results for input(s): CHOL in the last 8760 hours.  Scheduled Meds:  ascorbic  acid  250 mg Oral Q breakfast   atorvastatin   20 mg Oral QPM   ferrous sulfate   325 mg Oral Q breakfast   heparin   5,000 Units Subcutaneous Q8H   hydrALAZINE   10 mg Oral BID   multivitamin with minerals  1 tablet Oral Daily   Continuous Infusions:  sodium chloride  40 mL/hr at 04/01/24 0600   PRN Meds:.acetaminophen  **OR** acetaminophen , ondansetron  **OR** ondansetron  (ZOFRAN ) IV, polyethylene glycol  Assessment/Plan: Weakness Complete heart block HTN Arthritis Primary insomnia  Plan: EP consult. NPO.   LOS: 0 days   Time spent including chart review, lab review, examination, discussion with patient/ Nurse/EP : 30  min   Salena Negri  MD  04/01/2024, 9:25 AM

## 2024-04-01 NOTE — Progress Notes (Signed)
  Echocardiogram 2D Echocardiogram has been performed.  Koleen KANDICE Popper, RDCS 04/01/2024, 10:35 AM

## 2024-04-01 NOTE — Discharge Instructions (Signed)
 After Your Pacemaker   You have a Medtronic Pacemaker  If you have a Medtronic or Biotronik device, plug in your home monitor once you get home, and no manual interaction is required.   If you have an [employer] or [employer] device, plug your home monitor once you get home, sit near the device, and press the large activation button. Sit nearby until the process is complete, usually notated by lights on the monitor.   If you were set up for monitoring using an app on your phone, make sure the app remains open in the background and the Bluetooth remains on.  ACTIVITY Do not lift your arm above shoulder height for 1 week after your procedure. After 7 days, you may progress as below.  You should remove your sling 24 hours after your procedure, unless otherwise instructed by your provider.     Tuesday April 08, 2024  Wednesday April 09, 2024 Thursday April 10, 2024 Friday April 11, 2024   Do not lift, push, pull, or carry anything over 10 pounds with the affected arm until 6 weeks (Tuesday May 13, 2024 ) after your procedure.   You may drive AFTER your wound check, unless you have been told otherwise by your provider.   Ask your healthcare provider when you can go back to work   INCISION/Dressing If you are on a blood thinner such as Coumadin, Xarelto, Eliquis, Plavix, or Pradaxa please confirm with your provider when this should be resumed.   If large square, outer bandage is left in place, this can be removed after 24 hours from your procedure. Do not remove steri-strips or glue as below.   If a PRESSURE DRESSING (a bulky dressing that usually goes up over your shoulder) was applied or left in place, please follow instructions given by your provider on when to return to have this removed.   Monitor your Pacemaker site for redness, swelling, and drainage. Call the device clinic at [phone] if you experience these symptoms or fever/chills.  If your incision is sealed  with Steri-strips or staples, you may shower 7 days after your procedure or when told by your provider. Do not remove the steri-strips or let the shower hit directly on your site. You may wash around your site with soap and water.    If you were discharged in a sling, please do not wear this during the day more than 48 hours after your surgery unless otherwise instructed. This may increase the risk of stiffness and soreness in your shoulder.   Avoid lotions, ointments, or perfumes over your incision until it is well-healed.  You may use a hot tub or a pool AFTER your wound check appointment if the incision is completely closed.  Pacemaker Alerts:  Some alerts are vibratory and others beep. These are NOT emergencies. Please call our office to let us  know. If this occurs at night or on weekends, it can wait until the next business day. Send a remote transmission.  If your device is capable of reading fluid status (for heart failure), you will be offered monthly monitoring to review this with you.   DEVICE MANAGEMENT Remote monitoring is used to monitor your pacemaker from home. This monitoring is scheduled every 91 days by our office. It allows us  to keep an eye on the functioning of your device to ensure it is working properly. You will routinely see your Electrophysiologist annually (more often if necessary).  This will appear as a REMOTE check on your  MyChart schedule. These are automatic and there is nothing for you to manually do unless otherwise instructed.  You should receive your ID card for your new device in 4-8 weeks. Keep this card with you at all times once received. Consider wearing a medical alert bracelet or necklace.  Your Pacemaker may be MRI compatible. This will be discussed at your next office visit/wound check.  You should avoid contact with strong electric or magnetic fields.   Do not use amateur (ham) radio equipment or electric (arc) welding torches. MP3 player headphones  with magnets should not be used. Some devices are safe to use if held at least 12 inches (30 cm) from your Pacemaker. These include power tools, lawn mowers, and speakers. If you are unsure if something is safe to use, ask your health care provider.  When using your cell phone, hold it to the ear that is on the opposite side from the Pacemaker. Do not leave your cell phone in a pocket over the Pacemaker.  You may safely use electric blankets, heating pads, computers, and microwave ovens.  Call the office right away if: You have chest pain. You feel more short of breath than you have felt before. You feel more light-headed than you have felt before. Your incision starts to open up.  This information is not intended to replace advice given to you by your health care provider. Make sure you discuss any questions you have with your health care provider.

## 2024-04-01 NOTE — Plan of Care (Signed)
°  Problem: Clinical Measurements: °Goal: Ability to maintain clinical measurements within normal limits will improve °Outcome: Progressing °Goal: Diagnostic test results will improve °Outcome: Progressing °Goal: Cardiovascular complication will be avoided °Outcome: Progressing °  °

## 2024-04-01 NOTE — Consult Note (Signed)
 ELECTROPHYSIOLOGY CONSULT NOTE    Patient ID: Austin Harrington MRN: [id number], DOB/AGE: 02-17-1951 73 y.o.  Admit date: 03/31/2024 Date of Consult: 04/01/2024  Primary Physician: Claudene Pacific, MD Primary Cardiologist: None  Electrophysiologist: Dr. Nancey   Referring Provider: Dr. Claudene  Patient Profile: Austin Harrington is a 73 y.o. male with a history of second-degree type I AV block, hypertension, arthritis who is being seen today for the evaluation of complete heart block, consideration for pacemaker at the request of Dr. Claudene.  HPI:  Austin Harrington is a 73 y.o. male with above-noted medical history.  He presented to the ED with symptoms of exertional dyspnea after being found in complete heart block at PCP.  In the emergency department he was found to have complete heart block.  Patient follows with Dr. Claudene and was first noted with conduction abnormalities ~5 years ago, first degree AVB. 3 years ago, patient with some second degree AVB. Patient does not take any AV nodal agents.   On exam today, patient reports minimal change in energy level, exertional tolerance over the last year, though has felt more fatigued in recent timeframe. He denies any syncope in last year. He presented to the ED via his PCP office after presenting for a physical and being found bradycardic with CHB.    Labs Potassium4.1 (09/30 0304) Magnesium  1.9 (09/29 1707) Creatinine, ser  0.99 (09/30 0304) PLT  165 (09/30 0304) HGB  11.9* (09/30 0304) WBC 4.7 (09/30 0304) Troponin I (High Sensitivity)11 (09/29 1852).    Past Medical History:  Diagnosis Date   Arthritis    COVID-19 virus infection 07/2020   History of vertebral fracture    Hypertension      Surgical History:  Past Surgical History:  Procedure Laterality Date   BACK SURGERY     CARDIAC CATHETERIZATION N/A 08/19/2015   Procedure: Left Heart Cath and Coronary Angiography;  Surgeon: Pacific Claudene, MD;  Location: [facility] INVASIVE CV [facility];   Service: Cardiovascular;  Laterality: N/A;   INGUINAL HERNIA REPAIR  12/31/2018   INGUINAL HERNIA REPAIR Right 12/31/2018   Procedure: RIGHT HERNIA REPAIR INGUINAL ADULT;  Surgeon: Belinda Cough, MD;  Location: [facility] OR;  Service: General;  Laterality: Right;  GENERAL AND TAP BLOCK ANESTHESIA   INSERTION OF MESH Right 12/31/2018   Procedure: INSERTION OF MESH;  Surgeon: Belinda Cough, MD;  Location: [facility] OR;  Service: General;  Laterality: Right;     Medications Prior to Admission  Medication Sig Dispense Refill Last Dose/Taking   lisinopril (ZESTRIL) 10 MG tablet Take 10 mg by mouth daily.   Unknown    Inpatient Medications:   ascorbic acid   250 mg Oral Q breakfast   atorvastatin   20 mg Oral QPM   ferrous sulfate   325 mg Oral Q breakfast   heparin   5,000 Units Subcutaneous Q8H   hydrALAZINE   10 mg Oral BID   multivitamin with minerals  1 tablet Oral Daily    Allergies: No Known Allergies  History reviewed. No pertinent family history.   Physical Exam: Vitals:   04/01/24 0300 04/01/24 0458 04/01/24 0500 04/01/24 0725  BP:  (!) 125/55  130/63  Pulse:  (!) 39  (!) 39  Resp: 19 10  19   Temp:  98.6 F (37 C)  97.8 F (36.6 C)  TempSrc:  Oral  Oral  SpO2:  99%  100%  Weight:   84.9 kg   Height:        GEN- NAD, A&O  x 3, normal affect HEENT: Normocephalic, atraumatic Lungs- CTAB, Normal effort.  Heart- Regular rate and rhythm, No M/G/R.  GI- Soft, NT, ND.  Extremities- No clubbing, cyanosis, or edema   Radiology/Studies: DG Chest Portable 1 View Result Date: 03/31/2024 CLINICAL DATA:  Heart block. EXAM: PORTABLE CHEST 1 VIEW COMPARISON:  Chest radiograph dated 02/25/2023. FINDINGS: Mild cardiomegaly with mild central vascular congestion. No focal consolidation, pleural effusion, pneumothorax. No acute osseous pathology. IMPRESSION: Mild cardiomegaly with mild central vascular congestion. Electronically Signed   By: Vanetta Chou M.D.   On: 03/31/2024 17:31    EKG:03/31/24  ECG with CHB, HR 38 with junctional escape, IVCD (personally reviewed)  TELEMETRY: high grade AVB with HR 30s (personally reviewed)  DEVICE HISTORY: n/a  Assessment/Plan:  Complete heart block Symptomatic bradycardia Patient with progressive AV conduction disease over the last 5 years, now presenting with complete heart block, increased fatigue. Labs show normal thyroid  function, normal electrolytes and patient not on AV nodal agents. Echocardiogram shows LVEF 45-50%, moderate to severe MR, moderate TR. Given mildly reduced E and anticipated high pacing burden, will discuss left bundle area pacing vs CRT with Dr. Nancey. Keep NPO for tentative implant today.    For questions or updates, please contact [facility] Please consult [email or web address] for contact info under     Signed, Artist Pouch, PA-C  04/01/2024, 9:43 AM

## 2024-04-02 ENCOUNTER — Inpatient Hospital Stay (HOSPITAL_COMMUNITY)

## 2024-04-02 MED ORDER — FERROUS SULFATE 325 (65 FE) MG PO TABS: 325.0000 mg | ORAL_TABLET | Freq: Every day | ORAL | 3 refills | Status: AC

## 2024-04-02 MED ORDER — ADULT MULTIVITAMIN W/MINERALS CH: 1.0000 | ORAL_TABLET | Freq: Every day | ORAL | 3 refills | Status: AC

## 2024-04-02 MED ORDER — HYDRALAZINE HCL 10 MG PO TABS: 10.0000 mg | ORAL_TABLET | Freq: Two times a day (BID) | ORAL | 3 refills | Status: AC

## 2024-04-02 MED ORDER — ASCORBIC ACID 250 MG PO TABS: 250.0000 mg | ORAL_TABLET | Freq: Every day | ORAL | 3 refills | Status: AC

## 2024-04-02 MED ORDER — ATORVASTATIN CALCIUM 20 MG PO TABS: 20.0000 mg | ORAL_TABLET | Freq: Every evening | ORAL | 3 refills | Status: AC

## 2024-04-02 NOTE — TOC Transition Note (Signed)
 Transition of Care [facility]) - Discharge Note   Patient Details  Name: Austin Harrington MRN: [id number] Date of Birth: 10/04/1950  Transition of Care [facility]) CM/SW Contact:  Waddell Barnie Rama, RN Phone Number: 04/02/2024, 10:27 AM   Clinical Narrative:    For possible dc today.  He has transport at dc, has no needs.         Patient Goals and CMS Choice            Discharge Placement                       Discharge Plan and Services Additional resources added to the After Visit Summary for                                       Social Drivers of Health (SDOH) Interventions SDOH Screenings   Food Insecurity: No Food Insecurity (04/01/2024)  Housing: Unknown (04/01/2024)  Transportation Needs: No Transportation Needs (04/01/2024)  Utilities: Not At Risk (04/01/2024)  Social Connections: Unknown (04/01/2024)  Tobacco Use: Medium Risk (03/31/2024)     Readmission Risk Interventions    04/02/2024   10:25 AM  Readmission Risk Prevention Plan  Medication Screening Complete  Transportation Screening Complete

## 2024-04-02 NOTE — Discharge Summary (Signed)
 Physician Discharge Summary  Patient ID: Austin Harrington MRN: [id number] DOB/AGE: 1951/04/16 73 y.o.  Admit date: 03/31/2024 Discharge date: 04/02/2024  Admission Diagnoses: Weakness Complete heart block HTN Arthritis Primary insomnia  Discharge Diagnoses:  Principal Problem:   Complete heart block (HCC) Active problems:   Weakness   HTN   Moderate to severe MR   Moderate TR   Arthritis   Primary insomnia  Discharged Condition: good Hospital Course: 73 years old black male with PMH of 2nd degree AV block type 1, HTN, Arthritis has recurrent episodes of weakness. He denies chest pain or palpitation.  5 years ago he started with 1st degree AV block, 3 years ago he had 2nd degree AV block and now he appears to be in advanced 2nd degree AV block v/s 3rd degree AV block. He is not on any beta blocker or calcium  channel AV blocking medications. His thyroid  function was normal last year. Today's TSH was normal. Troponin I is normal x 2. CBC showed borderline low normal Hgb. BMET was unremakable. His heart rate is mostly in 30's without hypotension.  His cardiac catheterization was normal 8 years ago. He underwent dual chamber permanent pacemaker placement yesterday by Dr. Eulas Furbish, MD. Currently he is in normal sinus rhythm with HR in 70's.  Consults: cardiology  Significant Diagnostic Studies: labs: Near normal CBC, BMET, Troponin I and TSH.  Chest x-ray: Normal without pneumothorax.  EKG: Complete heart block.  Echocardiogram:  Mild global hypokinesia with 45-50 % LV EF. Moderate to severe MR and moderate TR.  Treatments: procedures: Dual chamber permanent pacemaker.  Discharge Exam: Blood pressure (!) 149/77, pulse 60, temperature 98 F (36.7 C), temperature source Oral, resp. rate 20, height 5' 9 (1.753 m), weight 84.9 kg, SpO2 98%. General appearance: alert, cooperative and appears stated age. Head: Normocephalic, atraumatic. Eyes: Brown eyes, pink conjunctiva,  corneas clear. PERRL, EOM's intact.  Neck: No adenopathy, no carotid bruit, no JVD, supple, symmetrical, trachea midline and thyroid  not enlarged. Resp: Clear to auscultation bilaterally. Left pectoral pacemaker site is stable. Cardio: Regular rate and rhythm, S1, S2 normal, II/VI systolic murmur, no click, rub or gallop. GI: Soft, non-tender; bowel sounds normal; no organomegaly. Extremities: No edema, cyanosis or clubbing. Mild right forearm swelling from IV fluid infiltration. Skin: Warm and dry.  Neurologic: Alert and oriented X 3, normal strength and tone. Normal coordination and gait.  Disposition: Discharge disposition: 01-Home or Self Care        Allergies as of 04/02/2024   No Known Allergies      Medication List     TAKE these medications    ascorbic acid  250 MG tablet Commonly known as: VITAMIN C  Take 1 tablet (250 mg total) by mouth daily with breakfast.   atorvastatin  20 MG tablet Commonly known as: LIPITOR Take 1 tablet (20 mg total) by mouth every evening.   ferrous sulfate  325 (65 FE) MG tablet Take 1 tablet (325 mg total) by mouth daily with breakfast.   hydrALAZINE  10 MG tablet Commonly known as: APRESOLINE  Take 1 tablet (10 mg total) by mouth 2 (two) times daily.   lisinopril 10 MG tablet Commonly known as: ZESTRIL Take 10 mg by mouth daily.   multivitamin with minerals Tabs tablet Take 1 tablet by mouth daily. Start taking on: April 03, 2024        Follow-up Information     Claudene Pacific, MD. Call in 1 week(s).   Specialty: Cardiology Contact information: [address] [place]  [place] [address] [phone]                 Time spent: Review of old chart, current chart, lab, x-ray, cardiac tests and discussion with patient/Nurse/EP over 60 minutes.  Signed: Salena GORMAN Negri 04/02/2024, 4:13 PM

## 2024-04-02 NOTE — Plan of Care (Signed)
  Problem: Education: Goal: Knowledge of General Education information will improve Description: Including pain rating scale, medication(s)/side effects and non-pharmacologic comfort measures Outcome: Progressing   Problem: Clinical Measurements: Goal: Diagnostic test results will improve Outcome: Progressing   Problem: Safety: Goal: Ability to remain free from injury will improve Outcome: Progressing   Problem: Cardiac: Goal: Ability to achieve and maintain adequate cardiopulmonary perfusion will improve Outcome: Progressing

## 2024-04-02 NOTE — Progress Notes (Signed)
 Rounding Note   Patient Name: Austin Harrington Date of Encounter: 04/02/2024  [facility] Cardiologist: Dr. Claudene  Subjective  Feels well, denies implant site pain, thinks he may be able to tell that he feels better with pacing No CP, SOB  Scheduled Meds:  ascorbic acid   250 mg Oral Q breakfast   atorvastatin   20 mg Oral QPM   ferrous sulfate   325 mg Oral Q breakfast   hydrALAZINE   10 mg Oral BID   multivitamin with minerals  1 tablet Oral Daily   sodium chloride  flush  3 mL Intravenous Q12H   Continuous Infusions:   ceFAZolin  (ANCEF ) IV 1 g (04/02/24 0923)   PRN Meds: acetaminophen  **OR** acetaminophen , ondansetron  **OR** ondansetron  (ZOFRAN ) IV, polyethylene glycol   Vital Signs  Vitals:   04/01/24 1718 04/01/24 1924 04/02/24 0507 04/02/24 0730  BP: (!) 150/75 (!) 153/71 (!) 153/69 (!) 157/78  Pulse: 60 60 60 61  Resp: 20 18 13 15   Temp:  97.7 F (36.5 C) 97.7 F (36.5 C) 98 F (36.7 C)  TempSrc: Oral Oral Oral Oral  SpO2:  100% 100% 98%  Weight:      Height:        Intake/Output Summary (Last 24 hours) at 04/02/2024 0939 Last data filed at 04/02/2024 9076 Gross per 24 hour  Intake 861.66 ml  Output 3350 ml  Net -2488.34 ml      04/01/2024    5:00 AM 03/31/2024    5:03 PM 02/26/2023    5:10 AM  Last 3 Weights  Weight (lbs) 187 lb 2.7 oz 185 lb 189 lb 8 oz  Weight (kg) 84.9 kg 83.915 kg 85.957 kg      Telemetry  SR/ V paced  - Personally Reviewed  ECG   SR/V paced - Personally Reviewed  Physical Exam  GEN: No acute distress.   Neck: No JVD Cardiac: RRR, no murmurs, rubs, or gallops.  Respiratory: Clear to auscultation bilaterally. GI: Soft, nontender, non-distended  MS: No edema; No deformity. Neuro:  Nonfocal  Psych: Normal affect   PPM site: stable, no bleeding, hematoma  Labs High Sensitivity Troponin:   Recent Labs  Lab 03/31/24 1707 03/31/24 1852  TROPONINIHS 12 11     Chemistry Recent Labs  Lab 03/31/24 1707  04/01/24 0304  NA 139 139  K 4.6 4.1  CL 104 108  CO2 23 23  GLUCOSE 88 94  BUN 15 13  CREATININE 0.94 0.99  CALCIUM  9.4 8.9  MG 1.9  --   GFRNONAA >60 >60  ANIONGAP 12 8    Lipids No results for input(s): CHOL, TRIG, HDL, LABVLDL, LDLCALC, CHOLHDL in the last 168 hours.  Hematology Recent Labs  Lab 03/31/24 1707 03/31/24 2341 04/01/24 0304  WBC 6.7 5.0 4.7  RBC 4.24 3.93* 3.85*  HGB 13.2 12.0* 11.9*  HCT 39.9 35.9* 35.3*  MCV 94.1 91.3 91.7  MCH 31.1 30.5 30.9  MCHC 33.1 33.4 33.7  RDW 11.9 11.9 11.9  PLT 196 170 165   Thyroid   Recent Labs  Lab 03/31/24 1852  TSH 2.535    BNPNo results for input(s): BNP, PROBNP in the last 168 hours.  DDimer No results for input(s): DDIMER in the last 168 hours.   Radiology    Cardiac Studies  04/01/24: TTE 1. Left ventricular ejection fraction, by estimation, is 45 to 50%. The  left ventricle has mildly decreased function. The left ventricle  demonstrates global hypokinesis. Left ventricular diastolic parameters  were  normal.   2. Right ventricular systolic function is normal. The right ventricular  size is normal.   3. Left atrial size was moderately dilated.   4. Right atrial size was moderately dilated.   5. The mitral valve is degenerative. Moderate to severe mitral valve  regurgitation.   6. Tricuspid valve regurgitation is moderate.   7. The aortic valve is tricuspid. There is moderate calcification of the  aortic valve. There is mild thickening of the aortic valve. Aortic valve  regurgitation is not visualized. Aortic valve sclerosis/calcification is  present, without any evidence of  aortic stenosis.   8. There is mild (Grade II) atheroma plaque involving the aortic root and  ascending aorta.   Conclusion(s)/Recommendation(s): Findings consistent with Moderate  valvular disease of MV and TV.   Patient Profile   73 y.o. male w/PMHx of Arthritis HTN Conduction system disease  Admitted  for CHB/bradycardia  Assessment & Plan   Advanced heart block Long hx of the same with know asymptomatic Mobitz one and 2:1 AVblock >> now with evidence of CHB  S/p PPM 04/01/24 with Dr. Nancey LB area RV lead with excellent paced QRS this morning of Site is stable Device check this morning with good measurements CXR with stable lead position, no PTX Wound care and activity instructions were reviewed with the patient and placed in his AVS EP follow up is in place   For questions or updates, please contact [facility] Please consult [email or web address] for contact info under     Signed, Charlies Macario Arthur, PA-C  04/02/2024, 9:39 AM

## 2024-04-02 NOTE — TOC CM/SW Note (Signed)
 Transition of Care [facility]) - Inpatient Brief Assessment   Patient Details  Name: Austin Harrington MRN: [id number] Date of Birth: 08/20/1950  Transition [facility]) CM/SW Contact:    Waddell Barnie Rama, RN Phone Number: 04/02/2024, 10:26 AM   Clinical Narrative: From home alone, has PCP and insurance on file, states has no HH services in place at this time or DME at home.  States family member will transport them home at [employer] and family is support system, states gets medications from [facility] .  Pta self ambulatory.   There are no ICM needs identified  at this time.  Please place consult for any ICM needs.     Transition of Care Asessment: Insurance and Status: Insurance coverage has been reviewed Patient has primary care physician: Yes Home environment has been reviewed: home alone Prior level of function:: indep Prior/Current Home Services: No current home services Social Drivers of Health Review: SDOH reviewed no interventions necessary Readmission risk has been reviewed: Yes Transition of care needs: no transition of care needs at this time

## 2024-04-02 NOTE — Plan of Care (Signed)
  Problem: Health Behavior/Discharge Planning: Goal: Ability to manage health-related needs will improve Outcome: Progressing   Problem: Clinical Measurements: Goal: Ability to maintain clinical measurements within normal limits will improve Outcome: Progressing Goal: Cardiovascular complication will be avoided Outcome: Progressing   Problem: Pain Managment: Goal: General experience of comfort will improve and/or be controlled Outcome: Progressing

## 2024-04-02 NOTE — Progress Notes (Signed)
 Mobility Specialist Progress Note:    04/02/24 1127  Mobility  Activity Ambulated with assistance  Level of Assistance Standby assist, set-up cues, supervision of patient - no hands on  Assistive Device None  Distance Ambulated (ft) 100 ft  Activity Response Tolerated well  Mobility Referral Yes  Mobility visit 1 Mobility  Mobility Specialist Start Time (ACUTE ONLY) 1127  Mobility Specialist Stop Time (ACUTE ONLY) 1140  Mobility Specialist Time Calculation (min) (ACUTE ONLY) 13 min   Pt pleasant and agreeable to session. No c/o any symptoms. Pt able to move and ambulate well w/ no issues. Returned pt to recliner w/ all needs met. NT notified  Venetia Keel Mobility Specialist Please Contact via SecureChat or Rehab Office at [phone]

## 2024-04-08 ENCOUNTER — Encounter (HOSPITAL_COMMUNITY): Payer: Self-pay | Admitting: Internal Medicine

## 2024-04-08 ENCOUNTER — Ambulatory Visit (HOSPITAL_COMMUNITY)
Admission: RE | Admit: 2024-04-08 | Discharge: 2024-04-08 | Disposition: A | Discharge status: Home / Self Care | Source: Ambulatory Visit | Attending: Internal Medicine | Admitting: Internal Medicine

## 2024-04-08 ENCOUNTER — Telehealth: Payer: Self-pay

## 2024-04-08 VITALS — BP 140/90 | HR 67 | Ht 69.0 in | Wt 179.8 lb

## 2024-04-08 DIAGNOSIS — I480 Paroxysmal atrial fibrillation: Secondary | ICD-10-CM

## 2024-04-08 DIAGNOSIS — I4891 Unspecified atrial fibrillation: Secondary | ICD-10-CM

## 2024-04-08 DIAGNOSIS — D6869 Other thrombophilia: Secondary | ICD-10-CM | POA: Diagnosis not present

## 2024-04-08 MED ORDER — APIXABAN 5 MG PO TABS: 5.0000 mg | ORAL_TABLET | Freq: Two times a day (BID) | ORAL | 6 refills | Status: AC

## 2024-04-08 NOTE — Telephone Encounter (Signed)
 Alert received from [employer] for AT/AF Daily Burden > Threshold. AF in progress from 10/6 @ 21:33, controlled rates, no Hx of PAF, no OAC per EPIC - Route to triage high alert per protocol.  Remote transmission received today for updated presenting rhythm, remains in AF w/ controlled rates. Discussed referral to AF clinic to discuss further about starting an OAC d/t increased risk for CVA. Patient is agreeable. Advised patient to expect call from AF clinic to make apt. Pt voiced understanding and agreeable to plan.

## 2024-04-08 NOTE — Patient Instructions (Signed)
Eliquis 5 mg twice a day.

## 2024-04-08 NOTE — Progress Notes (Signed)
 Primary Care Physician: Claudene Pacific, MD Primary Cardiologist: None Electrophysiologist: None     Referring Physician: Device clinic alert     Austin Harrington is a 73 y.o. male with a history of second-degree type I AV block, arthritis, HTN and CHB s/p PPM who presents for consultation in the [facility]. Hospital admission 9/29-10/07/2023 for symptomatic bradycardia due to complete heart block s/p PPM on 9/30. Device clinic alert for new Afib with controlled HR in progress from 10/6. Patient has a CHADS2VASC score of 2.  On evaluation today, patient is currently in V paced rhythm. He notes to overall feel better since PPM was placed compared to previous. He does not appear to have cardiac awareness. He drinks 1 cup of coffee daily and does not drink alcohol . He does not snore and feels well-rested most mornings.   Today, he denies symptoms of palpitations, chest pain, shortness of breath, orthopnea, PND, lower extremity edema, dizziness, presyncope, syncope, bleeding, or neurologic sequela. The patient is tolerating medications without difficulties and is otherwise without complaint today.    Atrial Fibrillation Risk Factors:  he does not have symptoms or diagnosis of sleep apnea.   he has a BMI of Body mass index is 26.55 kg/m.SABRA Filed Weights   04/08/24 1525  Weight: 81.6 kg    Current Outpatient Medications  Medication Sig Dispense Refill   ascorbic acid  (VITAMIN C ) 250 MG tablet Take 1 tablet (250 mg total) by mouth daily with breakfast. 30 tablet 3   atorvastatin  (LIPITOR) 20 MG tablet Take 1 tablet (20 mg total) by mouth every evening. 30 tablet 3   ferrous sulfate  325 (65 FE) MG tablet Take 1 tablet (325 mg total) by mouth daily with breakfast. 30 tablet 3   hydrALAZINE  (APRESOLINE ) 10 MG tablet Take 1 tablet (10 mg total) by mouth 2 (two) times daily. 60 tablet 3   lisinopril (ZESTRIL) 10 MG tablet Take 10 mg by mouth daily.     Multiple  Vitamin (MULTIVITAMIN WITH MINERALS) TABS tablet Take 1 tablet by mouth daily. 30 tablet 3   No current facility-administered medications for this encounter.    Atrial Fibrillation Management history:  Previous antiarrhythmic drugs: none Previous cardioversions: none Previous ablations: none Anticoagulation history: none   ROS- All systems are reviewed and negative except as per the HPI above.  Physical Exam: BP (!) 140/90   Pulse 67   Ht 5' 9 (1.753 m)   Wt 81.6 kg   BMI 26.55 kg/m   GEN: Well nourished, well developed in no acute distress NECK: No JVD; No carotid bruits CARDIAC: Regular rate (V paced), no murmurs, rubs, gallops RESPIRATORY:  Clear to auscultation without rales, wheezing or rhonchi  ABDOMEN: Soft, non-tender, non-distended EXTREMITIES:  No edema; No deformity   EKG today demonstrates  Vent. rate 67 BPM PR interval * ms QRS duration 130 ms QT/QTcB 430/454 ms P-R-T axes * 81 -58 Ventricular-paced rhythm Abnormal ECG When compared with ECG of 02-Apr-2024 06:52, Previous ECG is present  Echo 04/01/24 demonstrated  1. Left ventricular ejection fraction, by estimation, is 45 to 50%. The  left ventricle has mildly decreased function. The left ventricle  demonstrates global hypokinesis. Left ventricular diastolic parameters  were normal.   2. Right ventricular systolic function is normal. The right ventricular  size is normal.   3. Left atrial size was moderately dilated.   4. Right atrial size was moderately dilated.   5. The mitral valve is degenerative.  Moderate to severe mitral valve  regurgitation.   6. Tricuspid valve regurgitation is moderate.   7. The aortic valve is tricuspid. There is moderate calcification of the  aortic valve. There is mild thickening of the aortic valve. Aortic valve  regurgitation is not visualized. Aortic valve sclerosis/calcification is  present, without any evidence of  aortic stenosis.   8. There is mild (Grade II)  atheroma plaque involving the aortic root and  ascending aorta.   ASSESSMENT & PLAN CHA2DS2-VASc Score = 2  The patient's score is based upon: CHF History: 0 HTN History: 1 Diabetes History: 0 Stroke History: 0 Vascular Disease History: 0 Age Score: 1 Gender Score: 0       ASSESSMENT AND PLAN: Paroxysmal Atrial Fibrillation (ICD10:  I48.0) The patient's CHA2DS2-VASc score is 2, indicating a 2.2% annual risk of stroke.    Patient is currently in V paced rhythm. Education provided about Afib. Discussion about triggers for Afib. We discussed that anticoagulation is necessary for stroke prevention and also to help prepare if patient requires cardioversion. We went over cardioversion as a procedure and what to expect. Due to reduced LVEF, would favor intervention and cardioversion as an initial step to try to restore sinus rhythm. Declines sleep study.   Secondary Hypercoagulable State (ICD10:  D68.69) The patient is at significant risk for stroke/thromboembolism based upon his CHA2DS2-VASc Score of 2.  Start Apixaban (Eliquis).  We discussed the reasoning behind anticoagulation in the setting of stroke prevention related to Afib. We discussed the benefits vs risks of anticoagulation. After discussion, patient would like to begin anticoagulation and understands the potential risks. Will begin Eliquis 5 mg BID.    Follow up 3 weeks to determine if needs cardioversion.   Terra Pac, [facility]  Afib Clinic [address] [place], [place] [address] [phone]

## 2024-04-09 ENCOUNTER — Telehealth: Payer: Self-pay | Admitting: Pharmacy Technician

## 2024-04-09 ENCOUNTER — Other Ambulatory Visit (HOSPITAL_COMMUNITY): Payer: Self-pay

## 2024-04-09 NOTE — Telephone Encounter (Signed)
   Ran test claim for 496.60. For a 30 day supply and the co-pay is 496.60 . PA is not needed at this time. Nothing saying this is a transition fill.   This test claim was processed through [facility]- copay amounts may vary at other pharmacies due to pharmacy/plan contracts, or as the patient moves through the different stages of their insurance plan.     No hf/no cardiomyopathy   PAP: Patient assistance application for Eliquis through [employer] ([employer] has been mailed to pt's home address on file.

## 2024-04-15 ENCOUNTER — Ambulatory Visit: Attending: Cardiology

## 2024-04-15 DIAGNOSIS — I442 Atrioventricular block, complete: Secondary | ICD-10-CM | POA: Diagnosis not present

## 2024-04-15 NOTE — Progress Notes (Signed)
 Normal dual chamber pacemaker wound check. Presenting rhythm: AP/VP. Wound well healed. Routine testing performed. Thresholds, sensing, and impedance consistent with implant measurements and at 3.5V safety margin/auto capture until 3 month visit. AT/AF burden 7.5%. + OAC on MAR. Reviewed arm restrictions to continue for 6 weeks total post op.  Pt enrolled in remote follow-up.

## 2024-04-15 NOTE — Patient Instructions (Signed)

## 2024-04-16 LAB — CUP PACEART INCLINIC DEVICE CHECK
Date Time Interrogation Session: 20251014170009
Implantable Lead Connection Status: 753985
Implantable Lead Connection Status: 753985
Implantable Lead Implant Date: 20250930
Implantable Lead Implant Date: 20250930
Implantable Lead Location: 753859
Implantable Lead Location: 753860
Implantable Lead Model: 3830
Implantable Lead Model: 5076
Implantable Pulse Generator Implant Date: 20250930

## 2024-04-23 NOTE — Telephone Encounter (Addendum)
 The patient called and will check if he got application and let me know

## 2024-04-28 ENCOUNTER — Ambulatory Visit: Payer: Self-pay | Admitting: Cardiovascular Disease

## 2024-04-29 ENCOUNTER — Ambulatory Visit (HOSPITAL_COMMUNITY)
Admission: RE | Admit: 2024-04-29 | Discharge: 2024-04-29 | Disposition: A | Discharge status: Home / Self Care | Source: Ambulatory Visit | Attending: Internal Medicine | Admitting: Internal Medicine

## 2024-04-29 ENCOUNTER — Encounter (HOSPITAL_COMMUNITY): Payer: Self-pay | Admitting: Internal Medicine

## 2024-04-29 VITALS — BP 140/92 | HR 84 | Ht 69.0 in | Wt 183.4 lb

## 2024-04-29 DIAGNOSIS — D6869 Other thrombophilia: Secondary | ICD-10-CM

## 2024-04-29 DIAGNOSIS — I480 Paroxysmal atrial fibrillation: Secondary | ICD-10-CM | POA: Diagnosis not present

## 2024-04-29 NOTE — Progress Notes (Signed)
 Primary Care Physician: Claudene Pacific, MD Primary Cardiologist: None Electrophysiologist: None     Referring Physician: Device clinic alert     Austin Harrington is a 73 y.o. male with a history of second-degree type I AV block, arthritis, HTN and CHB s/p PPM who presents for consultation in the [facility]. Hospital admission 9/29-10/07/2023 for symptomatic bradycardia due to complete heart block s/p PPM on 9/30. Device clinic alert for new Afib with controlled HR in progress from 10/6. Patient has a CHADS2VASC score of 2.  On evaluation today, patient is currently in V paced rhythm. He notes to overall feel better since PPM was placed compared to previous. He does not appear to have cardiac awareness. He drinks 1 cup of coffee daily and does not drink alcohol . He does not snore and feels well-rested most mornings.   On follow up 04/29/24, patient is currently in AV paced rhythm. He began Eliquis 5 mg BID after last office visit and has not missed any doses. No bleeding issues on OAC. He notes to feel better since last office visit.   Today, he denies symptoms of palpitations, chest pain, shortness of breath, orthopnea, PND, lower extremity edema, dizziness, presyncope, syncope, bleeding, or neurologic sequela. The patient is tolerating medications without difficulties and is otherwise without complaint today.    Atrial Fibrillation Risk Factors:  he does not have symptoms or diagnosis of sleep apnea.   he has a BMI of Body mass index is 27.08 kg/m.SABRA Filed Weights   04/29/24 1546  Weight: 83.2 kg     Current Outpatient Medications  Medication Sig Dispense Refill   apixaban (ELIQUIS) 5 MG TABS tablet Take 1 tablet (5 mg total) by mouth 2 (two) times daily. 60 tablet 6   ascorbic acid  (VITAMIN C ) 250 MG tablet Take 1 tablet (250 mg total) by mouth daily with breakfast. 30 tablet 3   atorvastatin  (LIPITOR) 20 MG tablet Take 1 tablet (20 mg total) by mouth  every evening. 30 tablet 3   ferrous sulfate  325 (65 FE) MG tablet Take 1 tablet (325 mg total) by mouth daily with breakfast. 30 tablet 3   hydrALAZINE  (APRESOLINE ) 10 MG tablet Take 1 tablet (10 mg total) by mouth 2 (two) times daily. 60 tablet 3   lisinopril (ZESTRIL) 10 MG tablet Take 10 mg by mouth daily.     Multiple Vitamin (MULTIVITAMIN WITH MINERALS) TABS tablet Take 1 tablet by mouth daily. 30 tablet 3   No current facility-administered medications for this encounter.    Atrial Fibrillation Management history:  Previous antiarrhythmic drugs: none Previous cardioversions: none Previous ablations: none Anticoagulation history: Eliquis   ROS- All systems are reviewed and negative except as per the HPI above.  Physical Exam: BP (!) 140/92   Pulse 84   Ht 5' 9 (1.753 m)   Wt 83.2 kg   BMI 27.08 kg/m   GEN- The patient is well appearing, alert and oriented x 3 today.   Neck - no JVD or carotid bruit noted Lungs- Clear to ausculation bilaterally, normal work of breathing Heart- Regular rate and rhythm, no murmurs, rubs or gallops, PMI not laterally displaced Extremities- no clubbing, cyanosis, or edema Skin - no rash or ecchymosis noted   EKG today demonstrates  Vent. rate 84 BPM PR interval 178 ms QRS duration 134 ms QT/QTcB 414/489 ms P-R-T axes 17 58 2 AV dual-paced rhythm Abnormal ECG When compared with ECG of 08-Apr-2024 15:28, Previous ECG is  present  Echo 04/01/24 demonstrated  1. Left ventricular ejection fraction, by estimation, is 45 to 50%. The  left ventricle has mildly decreased function. The left ventricle  demonstrates global hypokinesis. Left ventricular diastolic parameters  were normal.   2. Right ventricular systolic function is normal. The right ventricular  size is normal.   3. Left atrial size was moderately dilated.   4. Right atrial size was moderately dilated.   5. The mitral valve is degenerative. Moderate to severe mitral valve   regurgitation.   6. Tricuspid valve regurgitation is moderate.   7. The aortic valve is tricuspid. There is moderate calcification of the  aortic valve. There is mild thickening of the aortic valve. Aortic valve  regurgitation is not visualized. Aortic valve sclerosis/calcification is  present, without any evidence of  aortic stenosis.   8. There is mild (Grade II) atheroma plaque involving the aortic root and  ascending aorta.   ASSESSMENT & PLAN CHA2DS2-VASc Score = 2  The patient's score is based upon: CHF History: 0 HTN History: 1 Diabetes History: 0 Stroke History: 0 Vascular Disease History: 0 Age Score: 1 Gender Score: 0       ASSESSMENT AND PLAN: Paroxysmal Atrial Fibrillation (ICD10:  I48.0) The patient's CHA2DS2-VASc score is 2, indicating a 2.2% annual risk of stroke.    Patient is currently in AV paced rhythm. We will continue with current regimen without change.    Secondary Hypercoagulable State (ICD10:  D68.69) The patient is at significant risk for stroke/thromboembolism based upon his CHA2DS2-VASc Score of 2.  Continue Apixaban (Eliquis).  No missed doses. Check CBC today.    Follow up as scheduled with Dr. Nancey. Follow up May 2026 Afib clinic.   [facility]  Af[facility] [address] [place], [place] [address] [phone]

## 2024-04-30 ENCOUNTER — Ambulatory Visit (HOSPITAL_COMMUNITY): Payer: Self-pay | Admitting: Internal Medicine

## 2024-04-30 LAB — CBC
Hematocrit: 40.8 % (ref 37.5–51.0)
Hemoglobin: 13.6 g/dL (ref 13.0–17.7)
MCH: 31.3 pg (ref 26.6–33.0)
MCHC: 33.3 g/dL (ref 31.5–35.7)
MCV: 94 fL (ref 79–97)
Platelets: 207 x10E3/uL (ref 150–450)
RBC: 4.34 x10E6/uL (ref 4.14–5.80)
RDW: 11.6 % (ref 11.6–15.4)
WBC: 6 x10E3/uL (ref 3.4–10.8)

## 2024-04-30 NOTE — Telephone Encounter (Signed)
 Tried to call pt to see about application and no vm set up

## 2024-05-07 NOTE — Telephone Encounter (Signed)
 I reached the patient and he said he will fill out the application and will bring it up here

## 2024-05-13 ENCOUNTER — Ambulatory Visit (INDEPENDENT_AMBULATORY_CARE_PROVIDER_SITE_OTHER)

## 2024-05-13 DIAGNOSIS — I442 Atrioventricular block, complete: Secondary | ICD-10-CM | POA: Diagnosis not present

## 2024-05-15 LAB — CUP PACEART REMOTE DEVICE CHECK
Battery Remaining Longevity: 121 mo
Battery Voltage: 3.18 V
Brady Statistic AP VP Percent: 63.58 %
Brady Statistic AP VS Percent: 0.06 %
Brady Statistic AS VP Percent: 34.35 %
Brady Statistic AS VS Percent: 1.99 %
Brady Statistic RA Percent Paced: 57.7 %
Brady Statistic RV Percent Paced: 97.53 %
Date Time Interrogation Session: 20251110180215
Implantable Lead Connection Status: 753985
Implantable Lead Connection Status: 753985
Implantable Lead Implant Date: 20250930
Implantable Lead Implant Date: 20250930
Implantable Lead Location: 753859
Implantable Lead Location: 753860
Implantable Lead Model: 3830
Implantable Lead Model: 5076
Implantable Pulse Generator Implant Date: 20250930
Lead Channel Impedance Value: 266 Ohm
Lead Channel Impedance Value: 342 Ohm
Lead Channel Impedance Value: 399 Ohm
Lead Channel Impedance Value: 608 Ohm
Lead Channel Pacing Threshold Amplitude: 0.375 V
Lead Channel Pacing Threshold Amplitude: 0.75 V
Lead Channel Pacing Threshold Pulse Width: 0.4 ms
Lead Channel Pacing Threshold Pulse Width: 0.4 ms
Lead Channel Sensing Intrinsic Amplitude: 17.75 mV
Lead Channel Sensing Intrinsic Amplitude: 17.75 mV
Lead Channel Sensing Intrinsic Amplitude: 3.125 mV
Lead Channel Sensing Intrinsic Amplitude: 3.125 mV
Lead Channel Setting Pacing Amplitude: 3.5 V
Lead Channel Setting Pacing Amplitude: 3.5 V
Lead Channel Setting Pacing Pulse Width: 0.4 ms
Lead Channel Setting Sensing Sensitivity: 1.2 mV
Zone Setting Status: 755011
Zone Setting Status: 755011

## 2024-05-16 NOTE — Progress Notes (Signed)
 Remote PPM Transmission

## 2024-05-21 NOTE — Telephone Encounter (Signed)
 I called the patient to check on application and no answer and no vm

## 2024-06-02 ENCOUNTER — Ambulatory Visit: Payer: Self-pay | Admitting: Cardiovascular Disease

## 2024-06-05 ENCOUNTER — Encounter (HOSPITAL_COMMUNITY): Payer: Self-pay | Admitting: Surgery

## 2024-06-19 LAB — COLOGUARD: COLOGUARD: NEGATIVE

## 2024-07-10 ENCOUNTER — Ambulatory Visit: Admitting: Cardiovascular Disease

## 2024-07-29 ENCOUNTER — Ambulatory Visit: Admitting: Student

## 2024-07-29 NOTE — Progress Notes (Unsigned)
" °  Electrophysiology Office Note:   ID:  Austin Harrington, DOB [birth year], MRN [id number]  Primary Cardiologist: None Electrophysiologist: Eulas FORBES Furbish, MD  {Click to update primary MD,subspecialty MD or APP then REFRESH:1}    History of Present Illness:   Austin Harrington is a 74 y.o. male with h/o second-degree type I AV block, arthritis, HTN and CHB s/p PPM  seen today for routine electrophysiology follow-up s/p Pacemaker implant.  Since last being seen in our clinic the patient reports doing ***.  he denies chest pain, palpitations, dyspnea, PND, orthopnea, nausea, vomiting, dizziness, syncope, edema, weight gain, or early satiety.    Review of systems complete and found to be negative unless listed in HPI.   EP Information / Studies Reviewed:    EKG is ordered today. Personal review as below.       PPM Interrogation-  reviewed in detail today,  See PACEART report.  Arrhythmia/Device History Medtronic Dual Chamber PPM 04/01/2024 for CHB    Physical Exam:   VS:  There were no vitals taken for this visit.   Wt Readings from Last 3 Encounters:  04/29/24 183 lb 6.4 oz (83.2 kg)  04/08/24 179 lb 12.8 oz (81.6 kg)  04/01/24 187 lb 2.7 oz (84.9 kg)     GEN: No acute distress  NECK: No JVD; No carotid bruits CARDIAC: {EPRHYTHM:28826}, no murmurs, rubs, gallops RESPIRATORY:  Clear to auscultation without rales, wheezing or rhonchi  ABDOMEN: Soft, non-tender, non-distended EXTREMITIES:  {EDEMA LEVEL:28147::No} edema; No deformity   ASSESSMENT AND PLAN:    Second Degree AV block s/p Medtronic PPM  Normal PPM function See Pace Art report No changes today  Paroxysmal AF Secondary hypercoagulable state EKG today shows *** New diagnosis on his PPM Continue eliquis  5 mg BID for CHA2DS2VASc of at least 2   HTN Stable on current regimen   {Click here to Review PMH, Prob List, Meds, Allergies, SHx, FHx  :1}   Disposition:   Follow up with {EPPROVIDERS:28135::EP Team}  {EPFOLLOW UP:28173}  Signed, Ozell Prentice Passey, PA-C  "

## 2024-07-31 ENCOUNTER — Ambulatory Visit: Attending: Cardiology | Admitting: Student

## 2024-07-31 NOTE — Progress Notes (Unsigned)
" °  Electrophysiology Office Note:   Date:  07/31/2024  ID:  Austin Harrington, DOB 1951/06/17, MRN [id number]  Primary Cardiologist: None Primary Heart Failure: None Electrophysiologist: Eulas FORBES Furbish, MD *** {Click to update primary MD,subspecialty MD or APP then REFRESH:1}    History of Present Illness:   Austin Harrington is a 74 y.o. male with h/o *** seen today for {VISITTYPE:28148}  Review of systems complete and found to be negative unless listed in HPI.      EP Information / Studies Reviewed:    {EKGtoday:28818}     PPM Interrogation-  reviewed in detail today,  See PACEART report.  Arrhythmia/Device History: Medtronic Dual Chamber PPM implanted  04/01/2024     for CHB  Physical Exam:   VS:  There were no vitals taken for this visit.   Wt Readings from Last 3 Encounters:  04/29/24 183 lb 6.4 oz (83.2 kg)  04/08/24 179 lb 12.8 oz (81.6 kg)  04/01/24 187 lb 2.7 oz (84.9 kg)     GEN: Well nourished, well developed in no acute distress NECK: No JVD; No carotid bruits CARDIAC: {EPRHYTHM:28826}, no murmurs, rubs, gallops RESPIRATORY:  Clear to auscultation without rales, wheezing or rhonchi  ABDOMEN: Soft, non-tender, non-distended EXTREMITIES:  No edema; No deformity   ASSESSMENT AND PLAN:    {Blank single:19197::SND,CHB,Second Degree AV block,Tachy-Brady syndrome,Symptomatic bradycardia,Uncontrolled atrial arrhyhtmia s/p AV node ablation} s/p Medtronic PPM  {Implantable Device (PM/ICD) Function - BS:34510::Normal Device Function with significant (or New) sustained arrhythmias} See Pace Art report No changes today  Disposition:   Follow up with {EPPROVIDERS:28135::EP Team} {EPFOLLOW LE:71826}  Signed, Morene Phoenix, PA  "

## 2024-08-12 ENCOUNTER — Encounter

## 2024-11-11 ENCOUNTER — Encounter

## 2024-11-27 ENCOUNTER — Ambulatory Visit (HOSPITAL_COMMUNITY): Admitting: Internal Medicine

## 2025-02-10 ENCOUNTER — Encounter
